# Patient Record
Sex: Male | Born: 1935 | Smoking: Former smoker
Health system: Southern US, Community
[De-identification: ages and names within clinical notes are randomized; demographics above are authoritative.]

## PROBLEM LIST (undated history)

## (undated) DIAGNOSIS — E785 Hyperlipidemia, unspecified: Secondary | ICD-10-CM

## (undated) DIAGNOSIS — D649 Anemia, unspecified: Secondary | ICD-10-CM

## (undated) DIAGNOSIS — K5790 Diverticulosis of intestine, part unspecified, without perforation or abscess without bleeding: Secondary | ICD-10-CM

## (undated) DIAGNOSIS — I10 Essential (primary) hypertension: Secondary | ICD-10-CM

## (undated) DIAGNOSIS — Z87891 Personal history of nicotine dependence: Secondary | ICD-10-CM

## (undated) HISTORY — DX: Diverticulosis of intestine, part unspecified, without perforation or abscess without bleeding: K57.90

## (undated) HISTORY — DX: Personal history of nicotine dependence: Z87.891

## (undated) HISTORY — DX: Essential (primary) hypertension: I10

## (undated) HISTORY — DX: Hyperlipidemia, unspecified: E78.5

## (undated) HISTORY — DX: Anemia, unspecified: D64.9

---

## 2014-03-27 ENCOUNTER — Inpatient Hospital Stay (HOSPITAL_COMMUNITY)
Admission: EM | Admit: 2014-03-27 | Discharge: 2014-03-29 | DRG: 378 | Disposition: A | Discharge status: Home / Self Care | Payer: Medicare Other | Attending: Internal Medicine | Admitting: Internal Medicine

## 2014-03-27 ENCOUNTER — Encounter (HOSPITAL_COMMUNITY): Payer: Self-pay | Admitting: Emergency Medicine

## 2014-03-27 DIAGNOSIS — Z136 Encounter for screening for cardiovascular disorders: Secondary | ICD-10-CM | POA: Diagnosis not present

## 2014-03-27 DIAGNOSIS — Z87891 Personal history of nicotine dependence: Secondary | ICD-10-CM | POA: Diagnosis not present

## 2014-03-27 DIAGNOSIS — R197 Diarrhea, unspecified: Secondary | ICD-10-CM | POA: Diagnosis not present

## 2014-03-27 DIAGNOSIS — R030 Elevated blood-pressure reading, without diagnosis of hypertension: Secondary | ICD-10-CM | POA: Diagnosis not present

## 2014-03-27 DIAGNOSIS — Z880 Allergy status to penicillin: Secondary | ICD-10-CM

## 2014-03-27 DIAGNOSIS — K5900 Constipation, unspecified: Secondary | ICD-10-CM | POA: Diagnosis present

## 2014-03-27 DIAGNOSIS — Z8489 Family history of other specified conditions: Secondary | ICD-10-CM

## 2014-03-27 DIAGNOSIS — Z800 Family history of malignant neoplasm of digestive organs: Secondary | ICD-10-CM | POA: Diagnosis not present

## 2014-03-27 DIAGNOSIS — K922 Gastrointestinal hemorrhage, unspecified: Secondary | ICD-10-CM | POA: Diagnosis present

## 2014-03-27 DIAGNOSIS — D6959 Other secondary thrombocytopenia: Secondary | ICD-10-CM | POA: Diagnosis present

## 2014-03-27 DIAGNOSIS — D126 Benign neoplasm of colon, unspecified: Secondary | ICD-10-CM | POA: Diagnosis present

## 2014-03-27 DIAGNOSIS — K921 Melena: Secondary | ICD-10-CM

## 2014-03-27 DIAGNOSIS — K5731 Diverticulosis of large intestine without perforation or abscess with bleeding: Secondary | ICD-10-CM | POA: Diagnosis present

## 2014-03-27 DIAGNOSIS — D696 Thrombocytopenia, unspecified: Secondary | ICD-10-CM

## 2014-03-27 DIAGNOSIS — D509 Iron deficiency anemia, unspecified: Secondary | ICD-10-CM | POA: Diagnosis not present

## 2014-03-27 DIAGNOSIS — D62 Acute posthemorrhagic anemia: Secondary | ICD-10-CM | POA: Diagnosis present

## 2014-03-27 DIAGNOSIS — K625 Hemorrhage of anus and rectum: Secondary | ICD-10-CM | POA: Diagnosis not present

## 2014-03-27 LAB — PROTIME-INR
INR: 1.01 (ref 0.00–1.49)
PROTHROMBIN TIME: 13.3 s (ref 11.6–15.2)

## 2014-03-27 LAB — APTT: aPTT: 31 seconds (ref 24–37)

## 2014-03-27 LAB — CBC
HCT: 26.2 % — ABNORMAL LOW (ref 39.0–52.0)
HCT: 24.2 % — ABNORMAL LOW (ref 39.0–52.0)
HEMOGLOBIN: 8.7 g/dL — AB (ref 13.0–17.0)
Hemoglobin: 8.1 g/dL — ABNORMAL LOW (ref 13.0–17.0)
MCH: 31.3 pg (ref 26.0–34.0)
MCH: 31.4 pg (ref 26.0–34.0)
MCHC: 33.2 g/dL (ref 30.0–36.0)
MCHC: 33.5 g/dL (ref 30.0–36.0)
MCV: 94.2 fL (ref 78.0–100.0)
MCV: 93.8 fL (ref 78.0–100.0)
PLATELETS: 179 10*3/uL (ref 150–400)
Platelets: 184 10*3/uL (ref 150–400)
RBC: 2.78 MIL/uL — ABNORMAL LOW (ref 4.22–5.81)
RBC: 2.58 MIL/uL — AB (ref 4.22–5.81)
RDW: 12.8 % (ref 11.5–15.5)
RDW: 12.9 % (ref 11.5–15.5)
WBC: 8.2 10*3/uL (ref 4.0–10.5)
WBC: 7.4 10*3/uL (ref 4.0–10.5)

## 2014-03-27 LAB — COMPREHENSIVE METABOLIC PANEL
ALK PHOS: 59 U/L (ref 39–117)
ALT: 13 U/L (ref 0–53)
AST: 16 U/L (ref 0–37)
Albumin: 3.4 g/dL — ABNORMAL LOW (ref 3.5–5.2)
Anion gap: 10 (ref 5–15)
BILIRUBIN TOTAL: 0.8 mg/dL (ref 0.3–1.2)
BUN: 20 mg/dL (ref 6–23)
CALCIUM: 8.6 mg/dL (ref 8.4–10.5)
CO2: 27 mEq/L (ref 19–32)
Chloride: 102 mEq/L (ref 96–112)
Creatinine, Ser: 0.79 mg/dL (ref 0.50–1.35)
GFR calc non Af Amer: 85 mL/min — ABNORMAL LOW (ref >90)
GLUCOSE: 105 mg/dL — AB (ref 70–99)
POTASSIUM: 4.4 meq/L (ref 3.7–5.3)
Sodium: 139 mEq/L (ref 137–147)
Total Protein: 6.2 g/dL (ref 6.0–8.3)

## 2014-03-27 LAB — ABO/RH: ABO/RH(D): O POS

## 2014-03-27 LAB — POC OCCULT BLOOD, ED: FECAL OCCULT BLD: POSITIVE — AB

## 2014-03-27 LAB — PREPARE RBC (CROSSMATCH)

## 2014-03-27 MED ORDER — SODIUM CHLORIDE 0.9 % IV BOLUS (SEPSIS)
1000.0000 mL | Freq: Once | INTRAVENOUS | Status: AC
  Administered 2014-03-27: 1000 mL via INTRAVENOUS

## 2014-03-27 MED ORDER — ONDANSETRON HCL 4 MG PO TABS: 4.0000 mg | ORAL_TABLET | Freq: Four times a day (QID) | ORAL | Status: DC | PRN

## 2014-03-27 MED ORDER — BISACODYL 5 MG PO TBEC
10.0000 mg | DELAYED_RELEASE_TABLET | Freq: Once | ORAL | Status: AC
  Administered 2014-03-27: 10 mg via ORAL
  Filled 2014-03-27: qty 2

## 2014-03-27 MED ORDER — ONDANSETRON HCL 4 MG/2ML IJ SOLN: 4.0000 mg | Freq: Four times a day (QID) | INTRAMUSCULAR | Status: DC | PRN

## 2014-03-27 MED ORDER — PEG 3350-KCL-NA BICARB-NACL 420 G PO SOLR
4000.0000 mL | Freq: Once | ORAL | Status: AC
  Administered 2014-03-27: 4000 mL via ORAL

## 2014-03-27 NOTE — H&P (Signed)
[facility] History and Physical  Christopher Bowen ZOX:[id number] DOB: 28-Apr-1935 DOA: 03/27/2014  Referring physician:  Lorre Munroe PCP:  No primary provider on file.   Chief Complaint:  Rectal bleeding  HPI:  The patient is a 79 y.o. year-old male with no primary care doctor and therefore no past medical history who presents with rectal bleeding for the last 2-3 days.  The patient was last at their baseline health about three days ago.  2 days ago, he developed watery bloody stools, bright red blood that filled the toilet. He had approximately 3-4 episodes of bloody, nonmucousy diarrhea that day with abdominal cramping. The following day, he felt well. He denied any fevers, chills, nausea, vomiting, belching, increased flatulence. He states that his appetite was poor and he did not eat very well. This morning, he had a recurrence of the bright red blood per history came to the emergency department. He denies recent travel. He frequency food, however he denies eating any raw seafood. No new animal contacts. No other family members have been sick. He denies any recent antibiotic exposure or NSAID use. He has never had a colonoscopy, but denies any changes to his stooling patterns other than some mild increased constipation over the last 2 years. He denies any night sweats, fevers, weight loss. He continues to have stools on a daily basis, but occasionally has some pellet-like stools.  He denies palpitations, shortness of breath, fatigue, cold intolerance.  In the emergency department, his vital signs are stable. His labs were notable for hemoglobin of 8.7, normal platelet count. His BMP was within normal limits.  INR is pending.  He is being admitted for GI bleed.  Review of Systems:  General:  Denies fevers, chills, weight loss or gain HEENT:  Denies changes to hearing and vision, rhinorrhea, sinus congestion, sore throat CV:  Denies chest pain and palpitations, lower extremity edema.  PULM:   Denies SOB, wheezing, cough.   GI:  Denies nausea, vomiting, constipation   GU:  Denies dysuria, frequency, urgency ENDO:  Denies polyuria, polydipsia.   HEME:  Denies hematemesis, melena, abnormal bruising or bleeding.  LYMPH:  Denies lymphadenopathy.   MSK:  Denies arthralgias, myalgias.   DERM:  Denies skin rash or ulcer.   NEURO:  Denies focal numbness, weakness, slurred speech, confusion, facial droop.  PSYCH:  Denies anxiety and depression.    History reviewed. No pertinent past medical history. History reviewed. No pertinent past surgical history. Social History:  reports that he quit smoking about 45 years ago. His smoking use included Cigarettes. He smoked 0.50 packs per day. He does not have any smokeless tobacco history on file. He reports that he does not drink alcohol or use illicit drugs.    Allergies  Allergen Reactions  . Penicillins Anaphylaxis    Family History  Problem Relation Age of Onset  . Colon cancer Neg Hx   . Ulcerative colitis Neg Hx   . Crohn's disease Neg Hx      Prior to Admission medications   Not on File   Physical Exam: Filed Vitals:   03/27/14 1158 03/27/14 1411  BP: 129/65 124/65  Pulse: 94 75  Temp: 98 F (36.7 C) 97.9 F (36.6 C)  TempSrc: Oral Oral  Resp: 14 18  SpO2: 100% 100%     General:  Caucasian male, no acute distress  Eyes:  PERRL, anicteric, non-injected.  ENT:  Nares clear.  OP clear, non-erythematous without plaques or exudates.  MMM.  Neck:  Supple without TM or JVD.    Lymph:  No cervical, supraclavicular, or submandibular LAD.  Cardiovascular:  RRR, normal S1, S2, 1/6 systolic murmur at the LSB and RSB.  2+ pulses, warm extremities  Respiratory:  CTA bilaterally without increased WOB.  Abdomen:  NABS.  Soft, ND/NT.  No obvious organomegaly or masses  Skin:  No rashes or focal lesions.  Musculoskeletal:  Normal bulk and tone.  No LE edema.  Psychiatric:  A & O x 4.  Appropriate affect.  Neurologic:   CN 3-12 intact.  5/5 strength.  Sensation intact.  Labs on Admission:  Basic Metabolic Panel:  Recent Labs Lab 03/27/14 1314  NA 139  K 4.4  CL 102  CO2 27  GLUCOSE 105*  BUN 20  CREATININE 0.79  CALCIUM 8.6   Liver Function Tests:  Recent Labs Lab 03/27/14 1314  AST 16  ALT 13  ALKPHOS 59  BILITOT 0.8  PROT 6.2  ALBUMIN 3.4*   No results found for this basename: LIPASE, AMYLASE,  in the last 168 hours No results found for this basename: AMMONIA,  in the last 168 hours CBC:  Recent Labs Lab 03/27/14 1314  WBC 8.2  HGB 8.7*  HCT 26.2*  MCV 94.2  PLT 184   Cardiac Enzymes: No results found for this basename: CKTOTAL, CKMB, CKMBINDEX, TROPONINI,  in the last 168 hours  BNP (last 3 results) No results found for this basename: PROBNP,  in the last 8760 hours CBG: No results found for this basename: GLUCAP,  in the last 168 hours  Radiological Exams on Admission: No results found.  EKG:    Assessment/Plan Active Problems:   GI bleed   Acute blood loss anemia  ---  Rectal bleeding, likely lower GI bleed since BUN is within normal limits and he has had bright red blood per rectum.  Differential diagnosis includes AVM, colon cancer, bleeding polyp, diverticulosis. -  GI consult to establish care and consideration of inpatient colonoscopy -  Clear liquid diet -  Repeat CBC in 6 hours and again in AM -  Followup INR  Acute blood loss anemia -  CBCs as above -  Transfuse for hgb < 7 or for symptomatic anemia/hemodynamic instability in setting of active bleed  Patient follows with PCP on Wed, appt was already scheduled.  Will defer management of chronic medical conditions to PCP.    Diet:  Clear liquid Access:  PIV IVF:  Yes Proph:  SCDs  Code Status: full Family Communication: patient and his daughter Disposition Plan: Admit to MedSurg  Time spent: 60 min Janece Canterbury [facility] Pager [phone]  If 7PM-7AM, please contact  night-coverage [email or web address] Password [facility] 03/27/2014, 4:28 PM

## 2014-03-27 NOTE — ED Notes (Signed)
PER Rob, PA, waiting for him to talk to admitting doctor before blood transfusion is started.  Rn will f/u and continue to monitor.

## 2014-03-27 NOTE — Consult Note (Signed)
[facility] Consultation Note  Referring Provider: Dr. Janece Canterbury [facility]) Primary Care Physician:  No primary provider on file. Primary Gastroenterologist:  None  Reason for Consultation:  Hematochezia, anemia  HPI: Christopher Bowen is a 79 y.o. male admitted for, and whom we've been asked to see on behalf of, hematochezia and anemia.  Still works in [employer].  Bleeding started a couple days ago with several bouts of bright red hematochezia, yesterday bleeding stopped completely, then bleeding restarted again today.  No abdominal pain, fevers, recent sick contacts, recent antibiotics.  Has noticed recent trend in bowel habits, over the past few months, with trend to constipation.  No loss-of-appetite or unintentional weight loss.   Denies family history of inflammatory bowel disease or colon cancer.  Hgb today 8.7, normocytic.  Patient himself has never had a colonoscopy.  History reviewed. No pertinent past medical history.  History reviewed. No pertinent past surgical history.  Prior to Admission medications   Not on File    Current Facility-Administered Medications  Medication Dose Route Frequency Provider Last Rate Last Dose  . bisacodyl (DULCOLAX) EC tablet 10 mg  10 mg Oral Once Arta Silence, MD      . ondansetron [facility]) tablet 4 mg  4 mg Oral Q6H PRN Janece Canterbury, MD       Or  . ondansetron (ZOFRAN) injection 4 mg  4 mg Intravenous Q6H PRN Janece Canterbury, MD      . polyethylene glycol-electrolytes (NuLYTELY/GoLYTELY) solution 4,000 mL  4,000 mL Oral Once Arta Silence, MD        Allergies as of 03/27/2014 - Review Complete 03/27/2014  Allergen Reaction Noted  . Penicillins Anaphylaxis 03/27/2014    Family History  Problem Relation Age of Onset  . Colon cancer Neg Hx   . Ulcerative colitis Neg Hx   . Crohn's disease Neg Hx     History   Social History  . Marital Status: Unknown    Spouse Name: N/A    Number of Children: N/A  . Years of  Education: N/A   Occupational History  . Not on file.   Social History Main Topics  . Smoking status: Former Smoker -- 0.50 packs/day    Types: Cigarettes    Quit date: 09/17/1968  . Smokeless tobacco: Not on file  . Alcohol Use: No  . Drug Use: No  . Sexual Activity: Not on file   Other Topics Concern  . Not on file   Social History Narrative   Lives alone, and does not use assist device, no home health services. 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      Review of Systems: ROS Dr. Sheran Fava 03/27/14 reviewed and I agree  Physical Exam: Vital signs in last 24 hours: Temp:  [97.9 F (36.6 C)-98 F (36.7 C)] 98 F (36.7 C) (07/11 1646) Pulse Rate:  [72-94] 74 (07/11 1739) Resp:  [14-18] 18 (07/11 1739) BP: (115-153)/(57-65) 153/57 mmHg (07/11 1739) SpO2:  [99 %-100 %] 99 % (07/11 1739) Weight:  [95.255 kg (210 lb)] 95.255 kg (210 lb) (07/11 1739) Last BM Date: 03/27/14 General:   Alert,  Well-developed, well-nourished, pleasant and cooperative in NAD Head:  Normocephalic and atraumatic. Eyes:  Sclera clear, no icterus.   Conjunctiva pale Ears:  Normal auditory acuity. Nose:  No deformity, discharge,  or lesions. Mouth:  No deformity or lesions.  Oropharynx somewhat dry, somewhat pale.  Poor dentition Neck:  Supple; no masses or thyromegaly. Lungs:  Clear throughout to auscultation.  No wheezes, crackles, or rhonchi. No acute distress. Heart:  Regular rate and rhythm; no murmurs, clicks, rubs,  or gallops. Abdomen:  Soft, nontender and nondistended. No masses, hepatosplenomegaly or hernias noted. Normal bowel sounds, without guarding, and without rebound.     Msk:  Symmetrical without gross deformities. Normal posture. Pulses:  Normal pulses noted. Extremities:  Without clubbing or edema. Neurologic:  Alert and  oriented x4;  grossly normal neurologically. Skin:  Intact without significant lesions or rashes. Cervical Nodes:  No significant cervical adenopathy. Psych:  Alert  and cooperative. Normal mood and affect.   Lab Results:  Recent Labs  03/27/14 1314  WBC 8.2  HGB 8.7*  HCT 26.2*  PLT 184   BMET  Recent Labs  03/27/14 1314  NA 139  K 4.4  CL 102  CO2 27  GLUCOSE 105*  BUN 20  CREATININE 0.79  CALCIUM 8.6   LFT  Recent Labs  03/27/14 1314  PROT 6.2  ALBUMIN 3.4*  AST 16  ALT 13  ALKPHOS 59  BILITOT 0.8   PT/INR No results found for this basename: LABPROT, INR,  in the last 72 hours  Studies/Results: No results found.  Impression:  1.  Bloody diarrhea.  History most suggestive of lower GI (i.e., colonic) source of bleeding.  Most likely considerations include diverticular bleeding versus inflammatory colitis (Crohn's or ulcerative colitis).  No known history of aortic aneurysm to support consideration of aortoenteric fistula.  Doubt ischemic or infectious colitis.   2.  Anemia, presumed acute blood loss, likely due to #1 above.  Plan:  1.  Clear liquid diet, NPO (except for prep) after midnight. 2.  Colonoscopy tomorrow late morning versus early afternoon. 3.  Risks (bleeding, infection, bowel perforation that could require surgery, sedation-related changes in cardiopulmonary systems), benefits (identification and possible treatment of source of symptoms, exclusion of certain causes of symptoms), and alternatives (watchful waiting, radiographic imaging studies, empiric medical treatment) of colonoscopy were explained to patient/family in detail and patient wishes to proceed.   LOS: 0 days   [name] M  03/27/2014, 7:11 PM

## 2014-03-27 NOTE — ED Provider Notes (Signed)
CSN: [id number]     Arrival date & time 03/27/14  1146 History   First MD Initiated Contact with Patient 03/27/14 1213     Chief Complaint  Patient presents with  . Rectal Bleeding     (Consider location/radiation/quality/duration/timing/severity/associated sxs/prior Treatment) HPI Comments: Patient presents emergency department with chief complaint of rectal bleeding. He states that over the past 2 days, he has noticed some bright red blood per rectum. States that he has felt weak in the mornings, and a little dizzy. He reports that he is having a small amount of bleeding, even when he is not having bowel movements. He denies any fevers, chills, nausea, vomiting, diarrhea, dysuria. He states that he has been constipated for the past few months. He has not taken anything to alleviate his symptoms. He states that he is otherwise healthy. He has not taken any blood thinners. He has no history of GI bleed.  The history is provided by the patient. No language interpreter was used.    History reviewed. No pertinent past medical history. History reviewed. No pertinent past surgical history. History reviewed. No pertinent family history. History  Substance Use Topics  . Smoking status: Never Smoker   . Smokeless tobacco: Not on file  . Alcohol Use: No    Review of Systems  All other systems reviewed and are negative.     Allergies  Penicillins  Home Medications   Prior to Admission medications   Not on File   BP 129/65  Pulse 94  Temp(Src) 98 F (36.7 C) (Oral)  Resp 14  SpO2 100% Physical Exam  Nursing note and vitals reviewed. Constitutional: He is oriented to person, place, and time. He appears well-developed and well-nourished.  HENT:  Head: Normocephalic and atraumatic.  Eyes: Conjunctivae and EOM are normal. Pupils are equal, round, and reactive to light. Right eye exhibits no discharge. Left eye exhibits no discharge. No scleral icterus.  Neck: Normal range of  motion. Neck supple. No JVD present.  Cardiovascular: Normal rate, regular rhythm and normal heart sounds.  Exam reveals no gallop and no friction rub.   No murmur heard. Pulmonary/Chest: Effort normal and breath sounds normal. No respiratory distress. He has no wheezes. He has no rales. He exhibits no tenderness.  Abdominal: Soft. He exhibits no distension and no mass. There is no tenderness. There is no rebound and no guarding.  No focal abdominal tenderness, no RLQ tenderness or pain at McBurney's point, no RUQ tenderness or Murphy's sign, no left-sided abdominal tenderness, no fluid wave, or signs of peritonitis   Genitourinary:  Chaperone was present during exam.  Rectal exam remarkable for soft maroon colored stool, no hemorrhage, no bright red blood  Musculoskeletal: Normal range of motion. He exhibits no edema and no tenderness.  Neurological: He is alert and oriented to person, place, and time.  Skin: Skin is warm and dry.  Psychiatric: He has a normal mood and affect. His behavior is normal. Judgment and thought content normal.    ED Course  Procedures (including critical care time) Results for orders placed during the hospital encounter of 03/27/14  CBC      Result Value Ref Range   WBC 8.2  4.0 - 10.5 K/uL   RBC 2.78 (*) 4.22 - 5.81 MIL/uL   Hemoglobin 8.7 (*) 13.0 - 17.0 g/dL   HCT 26.2 (*) 39.0 - 52.0 %   MCV 94.2  78.0 - 100.0 fL   MCH 31.3  26.0 - 34.0 pg  MCHC 33.2  30.0 - 36.0 g/dL   RDW 12.8  11.5 - 15.5 %   Platelets 184  150 - 400 K/uL  COMPREHENSIVE METABOLIC PANEL      Result Value Ref Range   Sodium 139  137 - 147 mEq/L   Potassium 4.4  3.7 - 5.3 mEq/L   Chloride 102  96 - 112 mEq/L   CO2 27  19 - 32 mEq/L   Glucose, Bld 105 (*) 70 - 99 mg/dL   BUN 20  6 - 23 mg/dL   Creatinine, Ser 0.79  0.50 - 1.35 mg/dL   Calcium 8.6  8.4 - 10.5 mg/dL   Total Protein 6.2  6.0 - 8.3 g/dL   Albumin 3.4 (*) 3.5 - 5.2 g/dL   AST 16  0 - 37 U/L   ALT 13  0 - 53 U/L    Alkaline Phosphatase 59  39 - 117 U/L   Total Bilirubin 0.8  0.3 - 1.2 mg/dL   GFR calc non Af Amer 85 (*) >90 mL/min   GFR calc Af Amer >90  >90 mL/min   Anion gap 10  5 - 15  POC OCCULT BLOOD, ED      Result Value Ref Range   Fecal Occult Bld POSITIVE (*) NEGATIVE  TYPE AND SCREEN      Result Value Ref Range   ABO/RH(D) O POS     Antibody Screen NEG     Sample Expiration 03/30/2014     Unit Number [id number]     Blood Component Type RED CELLS,LR     Unit division 00     Status of Unit ALLOCATED     Transfusion Status OK TO TRANSFUSE     Crossmatch Result Compatible     Unit Number [id number]     Blood Component Type RBC LR PHER2     Unit division 00     Status of Unit ALLOCATED     Transfusion Status OK TO TRANSFUSE     Crossmatch Result Compatible    PREPARE RBC (CROSSMATCH)      Result Value Ref Range   Order Confirmation ORDER PROCESSED BY BLOOD BANK    ABO/RH      Result Value Ref Range   ABO/RH(D) O POS         EKG Interpretation None      MDM   Final diagnoses:  Gastrointestinal hemorrhage with melena   Medications  sodium chloride 0.9 % bolus 1,000 mL (1,000 mLs Intravenous New Bag/Given 03/27/14 1311)      Patient seen by and discussed with Dr. Ashok Cordia.  GI bleed.  No known hx.  No anti-coagulants. Will admit to medicine.  Montine Circle, PA-C 03/27/14 1554

## 2014-03-27 NOTE — ED Notes (Signed)
Called to give report, receiving RN will return phone call.

## 2014-03-27 NOTE — ED Notes (Signed)
Pt states he has been constipated the last few months and has had hard stools.  C/o BRB per rectum x 2 days. States that he has felt a little weak over the past couple of days.  States that it's not just when he uses the bathroom.  He states that it just oozes out.

## 2014-03-27 NOTE — ED Notes (Signed)
I tried twice to stick patient but was unsuccessful

## 2014-03-28 ENCOUNTER — Encounter (HOSPITAL_COMMUNITY): Payer: Self-pay | Admitting: *Deleted

## 2014-03-28 ENCOUNTER — Encounter (HOSPITAL_COMMUNITY)
Admission: EM | Disposition: A | Discharge status: Home / Self Care | Payer: Self-pay | Source: Home / Self Care | Attending: Internal Medicine

## 2014-03-28 DIAGNOSIS — D62 Acute posthemorrhagic anemia: Secondary | ICD-10-CM | POA: Diagnosis not present

## 2014-03-28 DIAGNOSIS — K921 Melena: Secondary | ICD-10-CM | POA: Diagnosis not present

## 2014-03-28 DIAGNOSIS — D696 Thrombocytopenia, unspecified: Secondary | ICD-10-CM

## 2014-03-28 DIAGNOSIS — D126 Benign neoplasm of colon, unspecified: Secondary | ICD-10-CM | POA: Diagnosis not present

## 2014-03-28 DIAGNOSIS — D509 Iron deficiency anemia, unspecified: Secondary | ICD-10-CM | POA: Diagnosis not present

## 2014-03-28 DIAGNOSIS — R197 Diarrhea, unspecified: Secondary | ICD-10-CM | POA: Diagnosis not present

## 2014-03-28 HISTORY — PX: COLONOSCOPY: SHX5424

## 2014-03-28 LAB — BASIC METABOLIC PANEL
ANION GAP: 9 (ref 5–15)
BUN: 15 mg/dL (ref 6–23)
CHLORIDE: 103 meq/L (ref 96–112)
CO2: 27 mEq/L (ref 19–32)
Calcium: 8.1 mg/dL — ABNORMAL LOW (ref 8.4–10.5)
Creatinine, Ser: 0.72 mg/dL (ref 0.50–1.35)
GFR calc non Af Amer: 88 mL/min — ABNORMAL LOW (ref >90)
Glucose, Bld: 98 mg/dL (ref 70–99)
Potassium: 3.9 mEq/L (ref 3.7–5.3)
SODIUM: 139 meq/L (ref 137–147)

## 2014-03-28 LAB — CBC
HCT: 21.4 % — ABNORMAL LOW (ref 39.0–52.0)
Hemoglobin: 7.1 g/dL — ABNORMAL LOW (ref 13.0–17.0)
MCH: 31.6 pg (ref 26.0–34.0)
MCHC: 33.2 g/dL (ref 30.0–36.0)
MCV: 95.1 fL (ref 78.0–100.0)
Platelets: 143 10*3/uL — ABNORMAL LOW (ref 150–400)
RBC: 2.25 MIL/uL — ABNORMAL LOW (ref 4.22–5.81)
RDW: 13 % (ref 11.5–15.5)
WBC: 5.7 10*3/uL (ref 4.0–10.5)

## 2014-03-28 SURGERY — COLONOSCOPY
Anesthesia: Moderate Sedation | Laterality: Left

## 2014-03-28 MED ORDER — SODIUM CHLORIDE 0.9 % IV SOLN
INTRAVENOUS | Status: DC
Start: 2014-03-28 — End: 2014-03-28
  Administered 2014-03-28: 500 mL via INTRAVENOUS

## 2014-03-28 MED ORDER — MIDAZOLAM HCL 5 MG/5ML IJ SOLN
INTRAMUSCULAR | Status: DC | PRN
  Administered 2014-03-28 (×2): 1 mg via INTRAVENOUS
  Administered 2014-03-28: 2 mg via INTRAVENOUS
  Administered 2014-03-28: 1 mg via INTRAVENOUS

## 2014-03-28 MED ORDER — FENTANYL CITRATE 0.05 MG/ML IJ SOLN
INTRAMUSCULAR | Status: AC
  Filled 2014-03-28: qty 2

## 2014-03-28 MED ORDER — MIDAZOLAM HCL 10 MG/2ML IJ SOLN
INTRAMUSCULAR | Status: AC
  Filled 2014-03-28: qty 2

## 2014-03-28 MED ORDER — FENTANYL CITRATE 0.05 MG/ML IJ SOLN
INTRAMUSCULAR | Status: DC | PRN
  Administered 2014-03-28 (×2): 25 ug via INTRAVENOUS

## 2014-03-28 NOTE — H&P (View-Only) (Signed)
[facility] PROGRESS NOTE  Christopher Bowen AFB:[id number] DOB: Aug 06, 1935 DOA: 03/27/2014 PCP: No primary provider on file.  Assessment/Plan  Rectal bleeding, likely lower GI bleed since BUN is within normal limits and he has had bright red blood per rectum. Differential diagnosis includes AVM, colon cancer, bleeding polyp, diverticulosis.   - appreciate GI assistance:  Completed cleanout and colonoscopy today  - Clear liquid diet   Acute blood loss anemia, hgb 7.1 and trending down  - CBCs as above  - Transfuse 2 units PRBC  Thrombocytopenia likely due to acute blood loss -  Repeat in AM  Patient follows with PCP on Wed, appt was already scheduled. Will defer management of chronic medical conditions to PCP.   Diet: NPO Access: PIV  IVF: Yes  Proph: SCDs   Code Status: full  Family Communication: patient and his daughter  Disposition Plan:  Awaiting colonoscopy    Consultants:  GI, Dr. Paulita Fujita  Procedures:  Transfuse 2 units PRBC on 7/12  Antibiotics:  none   HPI/Subjective:  Had two more bloody BMs yesterday, now pooping clear water from cleanout.  Denies fatigue, SOB, abdominal pain, nausea.    Objective: Filed Vitals:   03/28/14 0828 03/28/14 0853 03/28/14 0953 03/28/14 1053  BP: 124/61 121/71 114/72 135/75  Pulse: 76 80 74 74  Temp: 98.4 F (36.9 C) 98.1 F (36.7 C) 98.8 F (37.1 C) 97.9 F (36.6 C)  TempSrc: Oral Oral Oral Oral  Resp: 18 18 18 18   Height:      Weight:      SpO2: 97% 96% 96% 96%    Intake/Output Summary (Last 24 hours) at 03/28/14 1154 Last data filed at 03/28/14 0957  Gross per 24 hour  Intake   2490 ml  Output    650 ml  Net   1840 ml   Filed Weights   03/27/14 1739  Weight: 95.255 kg (210 lb)    Exam:   General:  CM, No acute distress  HEENT:  NCAT, MMM  Cardiovascular:  RRR, nl S1, S2, 1/6 systolic murmur LSB, 2+ pulses, warm extremities  Respiratory:  CTAB, no increased WOB  Abdomen:   NABS, soft,  NT/ND  MSK:   Normal tone and bulk, no LEE  Neuro:  Grossly intact  Data Reviewed: Basic Metabolic Panel:  Recent Labs Lab 03/27/14 1314 03/28/14 0536  NA 139 139  K 4.4 3.9  CL 102 103  CO2 27 27  GLUCOSE 105* 98  BUN 20 15  CREATININE 0.79 0.72  CALCIUM 8.6 8.1*   Liver Function Tests:  Recent Labs Lab 03/27/14 1314  AST 16  ALT 13  ALKPHOS 59  BILITOT 0.8  PROT 6.2  ALBUMIN 3.4*   No results found for this basename: LIPASE, AMYLASE,  in the last 168 hours No results found for this basename: AMMONIA,  in the last 168 hours CBC:  Recent Labs Lab 03/27/14 1314 03/27/14 1858 03/28/14 0536  WBC 8.2 7.4 5.7  HGB 8.7* 8.1* 7.1*  HCT 26.2* 24.2* 21.4*  MCV 94.2 93.8 95.1  PLT 184 179 143*   Cardiac Enzymes: No results found for this basename: CKTOTAL, CKMB, CKMBINDEX, TROPONINI,  in the last 168 hours BNP (last 3 results) No results found for this basename: PROBNP,  in the last 8760 hours CBG: No results found for this basename: GLUCAP,  in the last 168 hours  No results found for this or any previous visit (from the past 240 hour(s)).   Studies: No  results found.  Scheduled Meds:  Continuous Infusions:   Active Problems:   GI bleed   Acute blood loss anemia    Time spent: 30 min    Hafiz Irion, [facility] Pager [phone]. If 7PM-7AM, please contact night-coverage at [email or web address], password [facility] 03/28/2014, 11:54 AM  LOS: 1 day

## 2014-03-28 NOTE — Op Note (Signed)
[facility] [place], [address]   COLONOSCOPY PROCEDURE REPORT  PATIENT: Christopher Bowen, Christopher Bowen  MR#: [id number] BIRTHDATE: 12/06/1934 , 78  yrs. old GENDER: Male ENDOSCOPIST: Arta Silence, MD REFERRED XE:[facility] PROCEDURE DATE:  03/28/2014 PROCEDURE:   Colonoscopy with snare polypectomy ASA CLASS:   Class I INDICATIONS:hematochezia, anemia. MEDICATIONS: Fentanyl 50 mcg IV and Versed 5 mg IV DESCRIPTION OF PROCEDURE:   After the risks benefits and alternatives of the procedure were thoroughly explained, informed consent was obtained.  A digital rectal exam revealed no abnormalities of the rectum.   The Pentax Ped Colon [id number] endoscope was introduced through the anus and advanced to the terminal ileum which was intubated for a short distance. No adverse events experienced.   The quality of the prep was good.  The instrument was then slowly withdrawn as the colon was fully examined.  Findings:  Normal digital rectal exam.  Couple nodules protruding from the dentate line, one fairly beefy, appearance typical of squamous papillomata.  Retroflexion into rectum otherwise normal. 44mm pedunculated sigmoid polyp, 85mm sessile transverse polyp, 79mm sessile transverse polyp, all removed with hot snare.  Few medium-sized diverticula in the proximal transverse colon.  No other pathology noted.  No vascular anomalies.  No blood seen to the extent of our examination.  Distal 5cm of the terminal ileum was normal.            .  The scope was withdrawn and the procedure completed. ENDOSCOPIC IMPRESSION:     As above.  Suspect bleeding was diverticular in origin.  The start-stop-start and rather dramatic quality of his bleeding gives pause to consider primary aortoenteric fistula, but this would be quite rare.  Normal BUN argues against GI bleed proximal to ligament of Treitz.  RECOMMENDATIONS:     1.  Watch for potential complications of procedure. 2.   Avoid ASA, NSAIDs for 3 days post-polypectomy. 3.  Await polypectomy results.  Might consider repeat surveillance colonoscopy in 3-5 years. 4.  Advance diet today. 5.  Abdominal ultrasound tomorrow to  exclude aortic aneurysm. 6.  Consider elective surgical evaluation for patient's presumed squamous papillomata. 7.  If patient has interval rebleeding, would do tagged RBC study as next step in evaluation. 8.  [facility] to follow. If U/S normal and no rebleeding, consider discharge tomorrow.  eSigned:  Arta Silence, MD 03/28/2014 1:45 PM   cc:

## 2014-03-28 NOTE — Progress Notes (Addendum)
[facility] PROGRESS NOTE  Christopher Bowen GBE:[id number] DOB: 1935-07-27 DOA: 03/27/2014 PCP: No primary provider on file.  Assessment/Plan  Rectal bleeding, likely lower GI bleed since BUN is within normal limits and he has had bright red blood per rectum. Differential diagnosis includes AVM, colon cancer, bleeding polyp, diverticulosis.   - appreciate GI assistance:  Completed cleanout and colonoscopy today  - Clear liquid diet   Acute blood loss anemia, hgb 7.1 and trending down  - CBCs as above  - Transfuse 2 units PRBC  Thrombocytopenia likely due to acute blood loss -  Repeat in AM  Patient follows with PCP on Wed, appt was already scheduled. Will defer management of chronic medical conditions to PCP.   Diet: NPO Access: PIV  IVF: Yes  Proph: SCDs   Code Status: full  Family Communication: patient and his daughter  Disposition Plan:  Awaiting colonoscopy    Consultants:  GI, Dr. Paulita Fujita  Procedures:  Transfuse 2 units PRBC on 7/12  Antibiotics:  none   HPI/Subjective:  Had two more bloody BMs yesterday, now pooping clear water from cleanout.  Denies fatigue, SOB, abdominal pain, nausea.    Objective: Filed Vitals:   03/28/14 0828 03/28/14 0853 03/28/14 0953 03/28/14 1053  BP: 124/61 121/71 114/72 135/75  Pulse: 76 80 74 74  Temp: 98.4 F (36.9 C) 98.1 F (36.7 C) 98.8 F (37.1 C) 97.9 F (36.6 C)  TempSrc: Oral Oral Oral Oral  Resp: 18 18 18 18   Height:      Weight:      SpO2: 97% 96% 96% 96%    Intake/Output Summary (Last 24 hours) at 03/28/14 1154 Last data filed at 03/28/14 0957  Gross per 24 hour  Intake   2490 ml  Output    650 ml  Net   1840 ml   Filed Weights   03/27/14 1739  Weight: 95.255 kg (210 lb)    Exam:   General:  CM, No acute distress  HEENT:  NCAT, MMM  Cardiovascular:  RRR, nl S1, S2, 1/6 systolic murmur LSB, 2+ pulses, warm extremities  Respiratory:  CTAB, no increased WOB  Abdomen:   NABS, soft,  NT/ND  MSK:   Normal tone and bulk, no LEE  Neuro:  Grossly intact  Data Reviewed: Basic Metabolic Panel:  Recent Labs Lab 03/27/14 1314 03/28/14 0536  NA 139 139  K 4.4 3.9  CL 102 103  CO2 27 27  GLUCOSE 105* 98  BUN 20 15  CREATININE 0.79 0.72  CALCIUM 8.6 8.1*   Liver Function Tests:  Recent Labs Lab 03/27/14 1314  AST 16  ALT 13  ALKPHOS 59  BILITOT 0.8  PROT 6.2  ALBUMIN 3.4*   No results found for this basename: LIPASE, AMYLASE,  in the last 168 hours No results found for this basename: AMMONIA,  in the last 168 hours CBC:  Recent Labs Lab 03/27/14 1314 03/27/14 1858 03/28/14 0536  WBC 8.2 7.4 5.7  HGB 8.7* 8.1* 7.1*  HCT 26.2* 24.2* 21.4*  MCV 94.2 93.8 95.1  PLT 184 179 143*   Cardiac Enzymes: No results found for this basename: CKTOTAL, CKMB, CKMBINDEX, TROPONINI,  in the last 168 hours BNP (last 3 results) No results found for this basename: PROBNP,  in the last 8760 hours CBG: No results found for this basename: GLUCAP,  in the last 168 hours  No results found for this or any previous visit (from the past 240 hour(s)).   Studies: No  results found.  Scheduled Meds:  Continuous Infusions:   Active Problems:   GI bleed   Acute blood loss anemia    Time spent: 30 min    Marguriete Wootan, [facility] Pager [phone]. If 7PM-7AM, please contact night-coverage at [email or web address], password [facility] 03/28/2014, 11:54 AM  LOS: 1 day

## 2014-03-28 NOTE — Interval H&P Note (Signed)
History and Physical Interval Note:  03/28/2014 12:50 PM  Christopher Bowen  has presented today for surgery, with the diagnosis of Bloody diarrhea  The various methods of treatment have been discussed with the patient and family. After consideration of risks, benefits and other options for treatment, the patient has consented to  Procedure(s): COLONOSCOPY (Left) as a surgical intervention .  The patient's history has been reviewed, patient examined, no change in status, stable for surgery.  I have reviewed the patient's chart and labs.  Questions were answered to the patient's satisfaction.     Christopher Bowen M  Assessment:  1.  Hematochezia, bloody diarrhea. 2.  Acute blood loss anemia.  Plan:  1.  Colonoscopy. 2.  Risks (bleeding, infection, bowel perforation that could require surgery, sedation-related changes in cardiopulmonary systems), benefits (identification and possible treatment of source of symptoms, exclusion of certain causes of symptoms), and alternatives (watchful waiting, radiographic imaging studies, empiric medical treatment) of colonoscopy were explained to patient/family in detail and patient wishes to proceed.

## 2014-03-29 ENCOUNTER — Encounter (HOSPITAL_COMMUNITY): Payer: Self-pay | Admitting: Gastroenterology

## 2014-03-29 ENCOUNTER — Inpatient Hospital Stay (HOSPITAL_COMMUNITY): Payer: Medicare Other

## 2014-03-29 DIAGNOSIS — R197 Diarrhea, unspecified: Secondary | ICD-10-CM | POA: Diagnosis not present

## 2014-03-29 DIAGNOSIS — D509 Iron deficiency anemia, unspecified: Secondary | ICD-10-CM | POA: Diagnosis not present

## 2014-03-29 DIAGNOSIS — Z136 Encounter for screening for cardiovascular disorders: Secondary | ICD-10-CM | POA: Diagnosis not present

## 2014-03-29 DIAGNOSIS — K921 Melena: Secondary | ICD-10-CM | POA: Diagnosis not present

## 2014-03-29 DIAGNOSIS — D126 Benign neoplasm of colon, unspecified: Secondary | ICD-10-CM | POA: Diagnosis not present

## 2014-03-29 DIAGNOSIS — D62 Acute posthemorrhagic anemia: Secondary | ICD-10-CM | POA: Diagnosis not present

## 2014-03-29 LAB — TYPE AND SCREEN
ABO/RH(D): O POS
Antibody Screen: NEGATIVE
UNIT DIVISION: 0
Unit division: 0

## 2014-03-29 NOTE — Progress Notes (Signed)
[facility] PROGRESS NOTE  Christopher Bowen HAL:[id number] DOB: 12-Jun-1935 DOA: 03/27/2014 PCP: Odette Fraction, MD  Assessment/Plan:   Rectal Bleeding, likely diverticular  - Colonoscopy yesterday, showed diverticular disease which is the presumed cause.  However, due to nature of the bleed, there is some concern for a fistula, plan to check for AAA.  - Please see colonoscopy report for full discussion - Ultrasound of abdomen for possible AAA - Avoid ASA, NSAIDs for 3 days after polypectomy  Acute blood loss anemia - Received 2 Units PRBC yesterday, no symptoms of anemia today  Elevated BP - Intermittent, no diagnosis of HTN and no meds - Follow up with PCP as below.   Christopher Bowen has had no PCP for a while and is not diagnosed with anything.  He has a PCP appointment on Wednesday, and we will try and have him discharged prior to that appointment.   He will need a repeat CBC at that time.    Consultants:  GI  Procedures/Studies:  No results found.  Colonoscopy done yesterday  Antibiotics:  none  Code Status: Full Family Communication: Pt and daughter at bedside Disposition Plan: Home when medically stable, possibly today pending ultrasound for AAA.   HPI/Subjective: No events overnight.  Patient doing well.  Curious when ultrasound will be done.   Objective: Filed Vitals:   03/28/14 1700 03/28/14 1800 03/28/14 2117 03/29/14 0600  BP: 149/79 117/67 120/74 144/67  Pulse: 81 75 73 66  Temp: 97.6 F (36.4 C) 97.9 F (36.6 C) 98.4 F (36.9 C) 97.6 F (36.4 C)  TempSrc: Oral Oral Oral Oral  Resp: 18 18 16 18   Height:      Weight:      SpO2: 99% 95% 95% 95%    Intake/Output Summary (Last 24 hours) at 03/29/14 1313 Last data filed at 03/29/14 0932  Gross per 24 hour  Intake 1393.33 ml  Output      0 ml  Net 1393.33 ml    Exam:   General:  Pt is alert, follows commands appropriately, not in acute distress, elderly gentleman with multiple Seb K's on  face  Cardiovascular: Regular rate and rhythm, S1/S2, no murmurs  Respiratory: Clear to auscultation bilaterally, no wheezing, no crackles  Abdomen: Soft, non tender, non distended, bowel sounds present,   Extremities: No edema  Neuro: Grossly nonfocal  Data Reviewed: Basic Metabolic Panel:  Recent Labs Lab 03/27/14 1314 03/28/14 0536  NA 139 139  K 4.4 3.9  CL 102 103  CO2 27 27  GLUCOSE 105* 98  BUN 20 15  CREATININE 0.79 0.72  CALCIUM 8.6 8.1*   Liver Function Tests:  Recent Labs Lab 03/27/14 1314  AST 16  ALT 13  ALKPHOS 59  BILITOT 0.8  PROT 6.2  ALBUMIN 3.4*  CBC:  Recent Labs Lab 03/27/14 1314 03/27/14 1858 03/28/14 0536  WBC 8.2 7.4 5.7  HGB 8.7* 8.1* 7.1*  HCT 26.2* 24.2* 21.4*  MCV 94.2 93.8 95.1  PLT 184 179 143*     Scheduled Meds:  Continuous Infusions:    Gilles Chiquito, MD  [facility] Pager [phone]  If 7PM-7AM, please contact night-coverage [email or web address] Password [email or web address] 03/29/2014, 1:13 PM   LOS: 2 days

## 2014-03-29 NOTE — ED Provider Notes (Signed)
Medical screening examination/treatment/procedure(s) were conducted as a shared visit with non-physician practitioner(s) and myself.  I personally evaluated the patient during the encounter.  Pt w recurrent rectal bleeding, ?secondary to diverticula.  abd soft nt. Iv ns. Labs.   Mirna Mires, MD 03/29/14 [phone]

## 2014-03-29 NOTE — Discharge Instructions (Signed)
Christopher Bowen - -   You were admitted for a bleed from your GI tract due to diverticulosis, more information below.  You had a colonoscopy which showed the diverticulae in your colon.  It is important that you follow up with a primary care physician in the near future to make sure your health is being well looked after.   At your PCP appointment, you should have a complete blood count and basic metabolic panel (blood work) done to make sure your kidneys and blood counts are doing okay.   If you have any of the following symptoms, please come back to the hospital: dizziness, lightheadedness, loss of consciousness, further bleeding from the rectum, dark stools, chest pain, problems breathing, nausea, vomiting.   Diverticulosis Diverticulosis is the condition that develops when small pouches (diverticula) form in the wall of your colon. Your colon, or large intestine, is where water is absorbed and stool is formed. The pouches form when the inside layer of your colon pushes through weak spots in the outer layers of your colon. CAUSES  No one knows exactly what causes diverticulosis. RISK FACTORS  Being older than 39. Your risk for this condition increases with age. Diverticulosis is rare in people younger than 40 years. By age 95, almost everyone has it.  Eating a low-fiber diet.  Being frequently constipated.  Being overweight.  Not getting enough exercise.  Smoking.  Taking over-the-counter pain medicines, like aspirin and ibuprofen. SYMPTOMS  Most people with diverticulosis do not have symptoms. DIAGNOSIS  Because diverticulosis often has no symptoms, health care providers often discover the condition during an exam for other colon problems. In many cases, a health care provider will diagnose diverticulosis while using a flexible scope to examine the colon (colonoscopy). TREATMENT  If you have never developed an infection related to diverticulosis, you may not need treatment. If you have  had an infection before, treatment may include:  Eating more fruits, vegetables, and grains.  Taking a fiber supplement.  Taking a live bacteria supplement (probiotic).  Taking medicine to relax your colon. HOME CARE INSTRUCTIONS   Drink at least 6-8 glasses of water each day to prevent constipation.  Try not to strain when you have a bowel movement.  Keep all follow-up appointments. If you have had an infection before:  Increase the fiber in your diet as directed by your health care provider or dietitian.  Take a dietary fiber supplement if your health care provider approves.  Only take medicines as directed by your health care provider. SEEK MEDICAL CARE IF:   You have abdominal pain.  You have bloating.  You have cramps.  You have not gone to the bathroom in 3 days. SEEK IMMEDIATE MEDICAL CARE IF:   Your pain gets worse.  Yourbloating becomes very bad.  You have a fever or chills, and your symptoms suddenly get worse.  You begin vomiting.  You have bowel movements that are bloody or black. MAKE SURE YOU:  Understand these instructions.  Will watch your condition.  Will get help right away if you are not doing well or get worse. Document Released: 05/31/2004 Document Revised: 09/08/2013 Document Reviewed: 07/29/2013 [facility] Patient Information 2015 [place], [place]. This information is not intended to replace advice given to you by your health care provider. Make sure you discuss any questions you have with your health care provider.

## 2014-03-29 NOTE — Discharge Summary (Signed)
Physician Discharge Summary  Christopher Bowen XFG:[id number] DOB: 12/20/1934 DOA: 03/27/2014  PCP: Odette Fraction, MD  Admit date: 03/27/2014 Discharge date: 03/29/2014  Recommendations for Outpatient Follow-up:  1. Pt will need to follow up with PCP in 2-3 days post discharge 2. Please obtain BMP to evaluate electrolytes and kidney function 3. Please also check CBC to evaluate Hg and Hct levels 4. F/U with Dr. Paulita Fujita in 2 weeks  Discharge Diagnoses:  Active Problems:   GI bleed   Acute blood loss anemia   Thrombocytopenia    Discharge Condition: Stable  Diet recommendation: Heart healthy diet   History of present illness from H&P by Dr. Sheran Fava:  The patient is a 79 y.o. year-old male with no primary care doctor and therefore no past medical history who presents with rectal bleeding for the last 2-3 days. The patient was last at their baseline health about three days ago. 2 days ago, he developed watery bloody stools, bright red blood that filled the toilet. He had approximately 3-4 episodes of bloody, nonmucousy diarrhea that day with abdominal cramping. The following day, he felt well. He denied any fevers, chills, nausea, vomiting, belching, increased flatulence. He states that his appetite was poor and he did not eat very well. This morning, he had a recurrence of the bright red blood per history came to the emergency department. He denies recent travel. He frequency food, however he denies eating any raw seafood. No new animal contacts. No other family members have been sick. He denies any recent antibiotic exposure or NSAID use. He has never had a colonoscopy, but denies any changes to his stooling patterns other than some mild increased constipation over the last 2 years. He denies any night sweats, fevers, weight loss. He continues to have stools on a daily basis, but occasionally has some pellet-like stools. He denies palpitations, shortness of breath, fatigue, cold intolerance.   In  the emergency department, his vital signs are stable. His labs were notable for hemoglobin of 8.7, normal platelet count. His BMP was within normal limits. INR is pending. He is being admitted for GI bleed.   Hospital Course:  GI bleed with Acute blood loss anemia from diverticular disease: patient admitted with rectal bleeding which was new for about 2-3 days.  He had not really had any other symptoms.  He was noted to have a low H/H in the ED with Hgb down to 8.7 and a nadir of 7.1.  His bleeding stopped in the hospital.  He received 2 units of PRBCs.  He had a colonoscopy by Dr. Paulita Fujita which showed diverticular disease and this was thought to be the cause of his GI bleed.  There was some concern, given the "start-stop" nature of his bleed that he could have a fistula, thus an ultrasound of his aorta was done and revealed no AAA.  Christopher Bowen was doing well on day of discharge with no new complaints, particularly no dizziness, lightheadedness, CP, SOB or any further bleeding.  He was deemed stable for discharge with follow up in the outpatient clinic in the near future.   Thrombocytopenia likely related to acute blood loss.  Nadir of 143.  Will need to be followed up as an outpatient.   Christopher Bowen has no PCP and is on no medications.  He is due to have a PCP appointment on Wednesday of this week (7/15) where he will discuss any screening for chronic disease he should need.  He had an ultrasound to evaluate  for AAA which was negative, report below.     Procedures/Studies: US Aorta  03/29/2014   CLINICAL DATA:  Anemia, evaluate for abdominal aortic aneurysm  EXAM: ULTRASOUND OF ABDOMINAL AORTA  TECHNIQUE: Ultrasound examination of the abdominal aorta was performed to evaluate for abdominal aortic aneurysm.  COMPARISON:  None.  FINDINGS: Abdominal Aorta  No aneurysm identified.  Maximum AP  Diameter:  2.6 cm  Maximum TRV  Diameter: 2.5 cm.  Mild right common iliac ectasia measuring 1.8 cm in maximal AP  diameter.  Left common iliac mild ectasia of measures 1.6 cm in maximal transverse diameter.  IMPRESSION: No aortic aneurysm identified.  Mild iliac arterial ectasia.   Electronically Signed   By: Conchita Paris M.D.   On: 03/29/2014 16:07      Consultations:  Gastroenterology  Antibiotics:  None  Discharge Exam: Filed Vitals:   03/29/14 1300  BP: 145/71  Pulse: 89  Temp: 97.9 F (36.6 C)  Resp: 16   Filed Vitals:   03/28/14 1800 03/28/14 2117 03/29/14 0600 03/29/14 1300  BP: 117/67 120/74 144/67 145/71  Pulse: 75 73 66 89  Temp: 97.9 F (36.6 C) 98.4 F (36.9 C) 97.6 F (36.4 C) 97.9 F (36.6 C)  TempSrc: Oral Oral Oral Oral  Resp: 18 16 18 16   Height:      Weight:      SpO2: 95% 95% 95% 98%    General: Pt is alert, follows commands appropriately, not in acute distress, elderly gentleman with multiple Seb K's on face  Cardiovascular: Regular rate and rhythm, S1/S2, no murmurs  Respiratory: Clear to auscultation bilaterally, no wheezing, no crackles  Abdomen: Soft, non tender, non distended, bowel sounds present,  Extremities: No edema  Neuro: Grossly nonfocal   Discharge Instructions  Discharge Instructions   Discharge patient    Complete by:  As directed   Home            Medication List    Notice   You have not been prescribed any medications.        The results of significant diagnostics from this hospitalization (including imaging, microbiology, ancillary and laboratory) are listed below for reference.     Microbiology: No results found for this or any previous visit (from the past 240 hour(s)).   Labs: Basic Metabolic Panel:  Recent Labs Lab 03/27/14 1314 03/28/14 0536  NA 139 139  K 4.4 3.9  CL 102 103  CO2 27 27  GLUCOSE 105* 98  BUN 20 15  CREATININE 0.79 0.72  CALCIUM 8.6 8.1*   Liver Function Tests:  Recent Labs Lab 03/27/14 1314  AST 16  ALT 13  ALKPHOS 59  BILITOT 0.8  PROT 6.2  ALBUMIN 3.4*    CBC:  Recent Labs Lab 03/27/14 1314 03/27/14 1858 03/28/14 0536  WBC 8.2 7.4 5.7  HGB 8.7* 8.1* 7.1*  HCT 26.2* 24.2* 21.4*  MCV 94.2 93.8 95.1  PLT 184 179 143*     SIGNED: Time coordinating discharge:  35 minutes  Marvine Encalade, MD  [facility] 03/29/2014, 4:58 PM Pager [phone]  If 7PM-7AM, please contact night-coverage [email or web address] Password [email or web address]

## 2014-03-29 NOTE — Progress Notes (Signed)
Subjective: No further bleeding. No abdominal pain.  Objective: Vital signs in last 24 hours: Temp:  [97.6 F (36.4 C)-98.8 F (37.1 C)] 97.6 F (36.4 C) (07/13 0600) Pulse Rate:  [66-83] 66 (07/13 0600) Resp:  [11-20] 18 (07/13 0600) BP: (99-154)/(31-83) 144/67 mmHg (07/13 0600) SpO2:  [95 %-100 %] 95 % (07/13 0600) Weight change:  Last BM Date: 03/28/14  PE: GEN:  NAD ABD:  Soft  Lab Results: CBC    Component Value Date/Time   WBC 5.7 03/28/2014 0536   RBC 2.25* 03/28/2014 0536   HGB 7.1* 03/28/2014 0536   HCT 21.4* 03/28/2014 0536   PLT 143* 03/28/2014 0536   MCV 95.1 03/28/2014 0536   MCH 31.6 03/28/2014 0536   MCHC 33.2 03/28/2014 0536   RDW 13.0 03/28/2014 0536   CMP     Component Value Date/Time   NA 139 03/28/2014 0536   K 3.9 03/28/2014 0536   CL 103 03/28/2014 0536   CO2 27 03/28/2014 0536   GLUCOSE 98 03/28/2014 0536   BUN 15 03/28/2014 0536   CREATININE 0.72 03/28/2014 0536   CALCIUM 8.1* 03/28/2014 0536   PROT 6.2 03/27/2014 1314   ALBUMIN 3.4* 03/27/2014 1314   AST 16 03/27/2014 1314   ALT 13 03/27/2014 1314   ALKPHOS 59 03/27/2014 1314   BILITOT 0.8 03/27/2014 1314   GFRNONAA 88* 03/28/2014 0536   GFRAA >90 03/28/2014 0536   Assessment:  1.  Hematochezia.  Suspect diverticular. 2.  Anemia, acute blood loss.  2 units transfused yesterday, no post-tx CBC.  Plan:  1.  Abdominal ultrasound to assess for aortic aneurysm. 2.  If no significantly enlarged AAA is seen on today's ultrasound, would advance diet and discharge home today. 3.  Patient will need follow-up with me [phone]) a couple weeks after discharge.   Landry Dyke 03/29/2014, 9:48 AM

## 2014-04-01 ENCOUNTER — Ambulatory Visit: Payer: Self-pay | Admitting: Family Medicine

## 2014-04-06 ENCOUNTER — Inpatient Hospital Stay: Payer: Medicare Other | Admitting: Family Medicine

## 2014-04-22 ENCOUNTER — Ambulatory Visit (INDEPENDENT_AMBULATORY_CARE_PROVIDER_SITE_OTHER): Payer: Medicare Other | Admitting: Family Medicine

## 2014-04-22 ENCOUNTER — Encounter: Payer: Self-pay | Admitting: Family Medicine

## 2014-04-22 VITALS — BP 130/70 | HR 60 | Temp 98.0°F | Resp 12 | Ht 69.0 in | Wt 206.0 lb

## 2014-04-22 DIAGNOSIS — D62 Acute posthemorrhagic anemia: Secondary | ICD-10-CM

## 2014-04-22 DIAGNOSIS — Z125 Encounter for screening for malignant neoplasm of prostate: Secondary | ICD-10-CM | POA: Diagnosis not present

## 2014-04-22 DIAGNOSIS — Z0000 Encounter for general adult medical examination without abnormal findings: Secondary | ICD-10-CM

## 2014-04-22 DIAGNOSIS — D649 Anemia, unspecified: Secondary | ICD-10-CM | POA: Insufficient documentation

## 2014-04-22 DIAGNOSIS — D539 Nutritional anemia, unspecified: Secondary | ICD-10-CM | POA: Diagnosis not present

## 2014-04-22 DIAGNOSIS — E785 Hyperlipidemia, unspecified: Secondary | ICD-10-CM | POA: Diagnosis not present

## 2014-04-22 DIAGNOSIS — Z23 Encounter for immunization: Secondary | ICD-10-CM | POA: Diagnosis not present

## 2014-04-22 DIAGNOSIS — M546 Pain in thoracic spine: Secondary | ICD-10-CM | POA: Diagnosis not present

## 2014-04-22 DIAGNOSIS — Z87891 Personal history of nicotine dependence: Secondary | ICD-10-CM | POA: Insufficient documentation

## 2014-04-22 DIAGNOSIS — K5790 Diverticulosis of intestine, part unspecified, without perforation or abscess without bleeding: Secondary | ICD-10-CM | POA: Insufficient documentation

## 2014-04-22 LAB — COMPLETE METABOLIC PANEL WITH GFR
ALK PHOS: 65 U/L (ref 39–117)
ALT: 14 U/L (ref 0–53)
AST: 16 U/L (ref 0–37)
Albumin: 4.3 g/dL (ref 3.5–5.2)
BUN: 18 mg/dL (ref 6–23)
CO2: 27 mEq/L (ref 19–32)
CREATININE: 0.77 mg/dL (ref 0.50–1.35)
Calcium: 9.1 mg/dL (ref 8.4–10.5)
Chloride: 103 mEq/L (ref 96–112)
GFR, Est African American: >89 mL/min
GFR, Est Non African American: 88 mL/min
GLUCOSE: 88 mg/dL (ref 70–99)
Potassium: 4.3 mEq/L (ref 3.5–5.3)
Sodium: 138 mEq/L (ref 135–145)
Total Bilirubin: 0.8 mg/dL (ref 0.2–1.2)
Total Protein: 7 g/dL (ref 6.0–8.3)

## 2014-04-22 LAB — ANEMIA PANEL
%SAT: 9 % — AB (ref 20–55)
ABS Retic: 74.9 10*3/uL (ref 19.0–186.0)
Ferritin: 58 ng/mL (ref 22–322)
Folate: 14.8 ng/mL
IRON: 37 ug/dL — AB (ref 42–165)
RBC.: 3.94 MIL/uL — ABNORMAL LOW (ref 4.22–5.81)
RETIC CT PCT: 1.9 % (ref 0.4–2.3)
TIBC: 392 ug/dL (ref 215–435)
UIBC: 355 ug/dL (ref 125–400)
Vitamin B-12: 248 pg/mL (ref 211–911)

## 2014-04-22 LAB — CBC WITH DIFFERENTIAL/PLATELET
Basophils Absolute: 0 10*3/uL (ref 0.0–0.1)
Basophils Relative: 0 % (ref 0–1)
Eosinophils Absolute: 0.1 10*3/uL (ref 0.0–0.7)
Eosinophils Relative: 1 % (ref 0–5)
HCT: 36.4 % — ABNORMAL LOW (ref 39.0–52.0)
HEMOGLOBIN: 12.4 g/dL — AB (ref 13.0–17.0)
Lymphocytes Relative: 22 % (ref 12–46)
Lymphs Abs: 1.5 10*3/uL (ref 0.7–4.0)
MCH: 31.5 pg (ref 26.0–34.0)
MCHC: 34.1 g/dL (ref 30.0–36.0)
MCV: 92.4 fL (ref 78.0–100.0)
MONO ABS: 0.5 10*3/uL (ref 0.1–1.0)
MONOS PCT: 7 % (ref 3–12)
Neutro Abs: 4.8 10*3/uL (ref 1.7–7.7)
Neutrophils Relative %: 70 % (ref 43–77)
Platelets: 231 10*3/uL (ref 150–400)
RBC: 3.94 MIL/uL — ABNORMAL LOW (ref 4.22–5.81)
RDW: 14.2 % (ref 11.5–15.5)
WBC: 6.8 10*3/uL (ref 4.0–10.5)

## 2014-04-22 LAB — LIPID PANEL
CHOL/HDL RATIO: 4.3 ratio
CHOLESTEROL: 204 mg/dL — AB (ref 0–200)
HDL: 47 mg/dL (ref >39)
LDL Cholesterol: 139 mg/dL — ABNORMAL HIGH (ref 0–99)
Triglycerides: 88 mg/dL (ref <150)
VLDL: 18 mg/dL (ref 0–40)

## 2014-04-22 NOTE — Addendum Note (Signed)
Addended by: Shary Decamp B on: 04/22/2014 11:11 AM   Modules accepted: Orders

## 2014-04-22 NOTE — Progress Notes (Signed)
Subjective:    Patient ID: Christopher Bowen, male    DOB: 07-08-1935, 79 y.o.   MRN: [id number]  HPI Patient is here for a complete physical exam. I have not seen the patient in over 3 years. Unfortunately, the patient recently was hospitalized with acute blood loss from diverticular bleeding. Patient had a colonoscopy performed in the hospital which revealed diverticulosis along with colon polyps. The patient's bleeding stopped spontaneously and he was discharged home. His hemoglobin on discharge was 7.1. No one has checked his blood counts since. He denies any melena or hematochezia. He denies any shortness of breath or chest pain. He denies any syncope. He does report fatigue. Overall he is doing well. He is overdue for a PSA. Patient has had Pneumovax 23. He is overdue for Prevnar 13.  He does complain of low back pain and midthoracic back pain for 2-3 months. The pain begins around the level of L1 and radiates up his spine and behind his right shoulder blade. It is worse with prolonged standing. It is worse with movement. He denies any neuropathic pain in his arms or legs. He denies any injury. Past Medical History  Diagnosis Date  . Diverticulosis   . Anemia   . Former smoker    Past Surgical History  Procedure Laterality Date  . Colonoscopy Left 03/28/2014    Procedure: COLONOSCOPY;  Surgeon: Arta Silence, MD;  Location: [facility];  Service: Endoscopy;  Laterality: Left;   No current outpatient prescriptions on file prior to visit.   No current facility-administered medications on file prior to visit.   Allergies  Allergen Reactions  . Penicillins Anaphylaxis   History   Social History  . Marital Status: Unknown    Spouse Name: N/A    Number of Children: N/A  . Years of Education: N/A   Occupational History  . Not on file.   Social History Main Topics  . Smoking status: Former Smoker -- 0.50 packs/day    Types: Cigarettes    Quit date: 09/17/1968  . Smokeless tobacco:  Never Used  . Alcohol Use: No  . Drug Use: No  . Sexual Activity: Not on file   Other Topics Concern  . Not on file   Social History Narrative   Lives alone, and does not use assist device, no home health services. 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     Family History  Problem Relation Age of Onset  . Colon cancer Neg Hx   . Ulcerative colitis Neg Hx   . Crohn's disease Neg Hx   . Diabetes Mother   . Cerebral aneurysm Mother   . Heart disease Father     age 77  . Diabetes Brother       Review of Systems  All other systems reviewed and are negative.      Objective:   Physical Exam  Vitals reviewed. Constitutional: He is oriented to person, place, and time. He appears well-developed and well-nourished. No distress.  HENT:  Head: Normocephalic and atraumatic.  Right Ear: External ear normal.  Left Ear: External ear normal.  Nose: Nose normal.  Mouth/Throat: Oropharynx is clear and moist. No oropharyngeal exudate.  Eyes: Conjunctivae and EOM are normal. Pupils are equal, round, and reactive to light. Right eye exhibits no discharge. Left eye exhibits no discharge. No scleral icterus.  Neck: Normal range of motion. Neck supple. No JVD present. No tracheal deviation present. No thyromegaly present.  Cardiovascular: Normal rate, regular rhythm, normal heart  sounds and intact distal pulses.  Exam reveals no gallop and no friction rub.   No murmur heard. Pulmonary/Chest: Effort normal and breath sounds normal. No respiratory distress. He has no wheezes. He has no rales. He exhibits no tenderness.  Abdominal: Soft. Bowel sounds are normal. He exhibits no distension and no mass. There is no tenderness. There is no rebound and no guarding.  Musculoskeletal: He exhibits no edema and no tenderness.       Thoracic back: He exhibits pain. He exhibits normal range of motion, no tenderness and no spasm.       Lumbar back: He exhibits decreased range of motion and pain. He exhibits no  tenderness, no bony tenderness and no spasm.  Lymphadenopathy:    He has no cervical adenopathy.  Neurological: He is alert and oriented to person, place, and time. He has normal reflexes. He displays normal reflexes. No cranial nerve deficit. He exhibits normal muscle tone. Coordination normal.  Skin: Skin is warm. No rash noted. He is not diaphoretic. No erythema. No pallor.  Psychiatric: He has a normal mood and affect. His behavior is normal. Judgment and thought content normal.          Assessment & Plan:  1. Right-sided thoracic back pain Begin the workup of his back pain back pain x-rays of his thoracic and lumbar spine. - DG Thoracic Spine W/Swimmers; Future - DG Lumbar Spine Complete; Future  2. Routine general medical examination at a health care facility  physical exam today is completely normal. I will check a CMP along with a fasting lipid panel. I also recommended the patient receive Prevnar 13. - COMPLETE METABOLIC PANEL WITH GFR - Lipid panel  3. Screening for prostate cancer - PSA, Medicare  4. Acute blood loss anemia I will check a CBC along with anemia panel. If the patient's hemoglobin is stable or improving I will likely start the patient on iron replacement therapy using iron sulfate 325 mg by mouth today until his CBC has normalized. - CBC with Differential - Anemia panel

## 2014-04-23 LAB — PSA, MEDICARE: PSA: 0.75 ng/mL (ref <=4.00)

## 2014-04-26 ENCOUNTER — Ambulatory Visit: Payer: Self-pay | Admitting: Family Medicine

## 2014-05-14 ENCOUNTER — Ambulatory Visit (INDEPENDENT_AMBULATORY_CARE_PROVIDER_SITE_OTHER): Payer: Medicare Other | Admitting: General Surgery

## 2014-06-02 ENCOUNTER — Ambulatory Visit (INDEPENDENT_AMBULATORY_CARE_PROVIDER_SITE_OTHER): Payer: Medicare Other | Admitting: General Surgery

## 2014-06-21 DIAGNOSIS — K629 Disease of anus and rectum, unspecified: Secondary | ICD-10-CM | POA: Diagnosis not present

## 2015-06-21 ENCOUNTER — Encounter: Payer: Self-pay | Admitting: Family Medicine

## 2015-06-21 ENCOUNTER — Ambulatory Visit (INDEPENDENT_AMBULATORY_CARE_PROVIDER_SITE_OTHER): Payer: Medicare Other | Admitting: Family Medicine

## 2015-06-21 VITALS — BP 162/82 | HR 60 | Temp 98.2°F | Resp 16 | Wt 209.0 lb

## 2015-06-21 DIAGNOSIS — I10 Essential (primary) hypertension: Secondary | ICD-10-CM | POA: Diagnosis not present

## 2015-06-21 DIAGNOSIS — Z23 Encounter for immunization: Secondary | ICD-10-CM | POA: Diagnosis not present

## 2015-06-21 LAB — COMPLETE METABOLIC PANEL WITH GFR
ALK PHOS: 60 U/L (ref 40–115)
ALT: 13 U/L (ref 9–46)
AST: 18 U/L (ref 10–35)
Albumin: 4.1 g/dL (ref 3.6–5.1)
BUN: 16 mg/dL (ref 7–25)
CALCIUM: 9.1 mg/dL (ref 8.6–10.3)
CO2: 29 mmol/L (ref 20–31)
Chloride: 100 mmol/L (ref 98–110)
Creat: 0.86 mg/dL (ref 0.70–1.18)
GFR, EST NON AFRICAN AMERICAN: 83 mL/min (ref >=60)
GFR, Est African American: >89 mL/min (ref >=60)
GLUCOSE: 89 mg/dL (ref 70–99)
POTASSIUM: 4.5 mmol/L (ref 3.5–5.3)
SODIUM: 136 mmol/L (ref 135–146)
Total Bilirubin: 1 mg/dL (ref 0.2–1.2)
Total Protein: 7.2 g/dL (ref 6.1–8.1)

## 2015-06-21 LAB — CBC WITH DIFFERENTIAL/PLATELET
BASOS ABS: 0 10*3/uL (ref 0.0–0.1)
Basophils Relative: 0 % (ref 0–1)
EOS ABS: 0.1 10*3/uL (ref 0.0–0.7)
Eosinophils Relative: 2 % (ref 0–5)
HCT: 42.6 % (ref 39.0–52.0)
Hemoglobin: 14.8 g/dL (ref 13.0–17.0)
LYMPHS ABS: 1.7 10*3/uL (ref 0.7–4.0)
Lymphocytes Relative: 26 % (ref 12–46)
MCH: 31.4 pg (ref 26.0–34.0)
MCHC: 34.7 g/dL (ref 30.0–36.0)
MCV: 90.4 fL (ref 78.0–100.0)
MPV: 12.2 fL (ref 8.6–12.4)
Monocytes Absolute: 0.5 10*3/uL (ref 0.1–1.0)
Monocytes Relative: 8 % (ref 3–12)
NEUTROS ABS: 4.3 10*3/uL (ref 1.7–7.7)
NEUTROS PCT: 64 % (ref 43–77)
Platelets: 207 10*3/uL (ref 150–400)
RBC: 4.71 MIL/uL (ref 4.22–5.81)
RDW: 13.7 % (ref 11.5–15.5)
WBC: 6.7 10*3/uL (ref 4.0–10.5)

## 2015-06-21 LAB — LIPID PANEL
CHOL/HDL RATIO: 4.6 ratio (ref <=5.0)
Cholesterol: 216 mg/dL — ABNORMAL HIGH (ref 125–200)
HDL: 47 mg/dL (ref >=40)
LDL Cholesterol: 144 mg/dL — ABNORMAL HIGH (ref <130)
Triglycerides: 123 mg/dL (ref <150)
VLDL: 25 mg/dL (ref <30)

## 2015-06-21 MED ORDER — LISINOPRIL-HYDROCHLOROTHIAZIDE 20-12.5 MG PO TABS: 1.0000 | ORAL_TABLET | Freq: Every day | ORAL | Status: DC

## 2015-06-21 NOTE — Addendum Note (Signed)
Addended by: Shary Decamp B on: 06/21/2015 10:51 AM   Modules accepted: Orders

## 2015-06-21 NOTE — Progress Notes (Signed)
   Subjective:    Patient ID: Christopher Bowen, male    DOB: 11-Apr-1935, 80 y.o.   MRN: [id number]  HPI  Last month or so, patient has been experiencing hypertension. His blood pressures frequently greater than 160/80. He is checking it regularly with his blood pressure cuff at home. He denies any chest pain shortness of breath or dyspnea on exertion. I checked his blood pressure today manually and found his blood pressure to be elevated at 162/82. He denies any dietary changes or medication changes. He denies any pain or anxiety. Past Medical History  Diagnosis Date  . Diverticulosis   . Anemia   . Former smoker    Past Surgical History  Procedure Laterality Date  . Colonoscopy Left 03/28/2014    Procedure: COLONOSCOPY;  Surgeon: Arta Silence, MD;  Location: [facility];  Service: Endoscopy;  Laterality: Left;   No current outpatient prescriptions on file prior to visit.   No current facility-administered medications on file prior to visit.   Allergies  Allergen Reactions  . Penicillins Anaphylaxis   Social History   Social History  . Marital Status: Unknown    Spouse Name: N/A  . Number of Children: N/A  . Years of Education: N/A   Occupational History  . Not on file.   Social History Main Topics  . Smoking status: Former Smoker -- 0.50 packs/day    Types: Cigarettes    Quit date: 09/17/1968  . Smokeless tobacco: Never Used  . Alcohol Use: No  . Drug Use: No  . Sexual Activity: Not on file   Other Topics Concern  . Not on file   Social History Narrative   Lives alone, and does not use assist device, no home health services. 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       Review of Systems  All other systems reviewed and are negative.      Objective:   Physical Exam  Constitutional: He appears well-developed and well-nourished.  Neck: No JVD present.  Cardiovascular: Normal rate, regular rhythm and normal heart sounds.   Pulmonary/Chest: Effort normal and breath  sounds normal. No respiratory distress. He has no wheezes. He has no rales.  Musculoskeletal: He exhibits no edema.  Vitals reviewed.         Assessment & Plan:  Benign essential HTN - Plan: lisinopril-hydrochlorothiazide (ZESTORETIC) 20-12.5 MG tablet, CBC with Differential, Lipid panel, COMPLETE METABOLIC PANEL WITH GFR  Blood pressures elevated. Begin Zestoretic 20/12.5 one by mouth daily. I will also check a CBC, CMP, fasting lipid panel. I would like to see the patient back in one month to recheck his blood pressure. I also gave the patient a flu shot today.

## 2015-07-29 ENCOUNTER — Ambulatory Visit (INDEPENDENT_AMBULATORY_CARE_PROVIDER_SITE_OTHER): Payer: Medicare Other | Admitting: Family Medicine

## 2015-07-29 ENCOUNTER — Encounter: Payer: Self-pay | Admitting: Family Medicine

## 2015-07-29 VITALS — BP 142/78 | HR 62 | Temp 98.0°F | Resp 16 | Ht 69.0 in | Wt 204.0 lb

## 2015-07-29 DIAGNOSIS — R0789 Other chest pain: Secondary | ICD-10-CM | POA: Diagnosis not present

## 2015-07-29 DIAGNOSIS — I10 Essential (primary) hypertension: Secondary | ICD-10-CM | POA: Diagnosis not present

## 2015-07-29 MED ORDER — LISINOPRIL-HYDROCHLOROTHIAZIDE 20-12.5 MG PO TABS: 2.0000 | ORAL_TABLET | Freq: Every day | ORAL | Status: DC

## 2015-07-29 NOTE — Progress Notes (Signed)
Subjective:    Patient ID: Christopher Bowen, male    DOB: November 17, 1934, 80 y.o.   MRN: [id number]  HPI  06/21/15 Last month or so, patient has been experiencing hypertension. His blood pressures frequently greater than 160/80. He is checking it regularly with his blood pressure cuff at home. He denies any chest pain shortness of breath or dyspnea on exertion. I checked his blood pressure today manually and found his blood pressure to be elevated at 162/82. He denies any dietary changes or medication changes. He denies any pain or anxiety.  At that time, my plan was: Blood pressure is elevated. Begin Zestoretic 20/12.5 one by mouth daily. I will also check a CBC, CMP, fasting lipid panel. I would like to see the patient back in one month to recheck his blood pressure. I also gave the patient a flu shot today.  No visits with results within 1 Month(s) from this visit. Latest known visit with results is:  Office Visit on 06/21/2015  Component Date Value Ref Range Status  . WBC 06/21/2015 6.7  4.0 - 10.5 K/uL Final  . RBC 06/21/2015 4.71  4.22 - 5.81 MIL/uL Final  . Hemoglobin 06/21/2015 14.8  13.0 - 17.0 g/dL Final  . HCT 06/21/2015 42.6  39.0 - 52.0 % Final  . MCV 06/21/2015 90.4  78.0 - 100.0 fL Final  . MCH 06/21/2015 31.4  26.0 - 34.0 pg Final  . MCHC 06/21/2015 34.7  30.0 - 36.0 g/dL Final  . RDW 06/21/2015 13.7  11.5 - 15.5 % Final  . Platelets 06/21/2015 207  150 - 400 K/uL Final  . MPV 06/21/2015 12.2  8.6 - 12.4 fL Final  . Neutrophils Relative % 06/21/2015 64  43 - 77 % Final  . Neutro Abs 06/21/2015 4.3  1.7 - 7.7 K/uL Final  . Lymphocytes Relative 06/21/2015 26  12 - 46 % Final  . Lymphs Abs 06/21/2015 1.7  0.7 - 4.0 K/uL Final  . Monocytes Relative 06/21/2015 8  3 - 12 % Final  . Monocytes Absolute 06/21/2015 0.5  0.1 - 1.0 K/uL Final  . Eosinophils Relative 06/21/2015 2  0 - 5 % Final  . Eosinophils Absolute 06/21/2015 0.1  0.0 - 0.7 K/uL Final  . Basophils Relative 06/21/2015 0   0 - 1 % Final  . Basophils Absolute 06/21/2015 0.0  0.0 - 0.1 K/uL Final  . Smear Review 06/21/2015 Criteria for review not met   Final  . Cholesterol 06/21/2015 216* 125 - 200 mg/dL Final  . Triglycerides 06/21/2015 123  <150 mg/dL Final  . HDL 06/21/2015 47  >=40 mg/dL Final  . Total CHOL/HDL Ratio 06/21/2015 4.6  <=5.0 Ratio Final  . VLDL 06/21/2015 25  <30 mg/dL Final  . LDL Cholesterol 06/21/2015 144* <130 mg/dL Final   Comment:   Total Cholesterol/HDL Ratio:CHD Risk                        Coronary Heart Disease Risk Table                                        Men       Women          1/2 Average Risk              3.4        3.3  Average Risk              5.0        4.4           2X Average Risk              9.6        7.1           3X Average Risk             23.4       11.0 Use the calculated Patient Ratio above and the CHD Risk table  to determine the patient's CHD Risk.   . Sodium 06/21/2015 136  135 - 146 mmol/L Final  . Potassium 06/21/2015 4.5  3.5 - 5.3 mmol/L Final  . Chloride 06/21/2015 100  98 - 110 mmol/L Final  . CO2 06/21/2015 29  20 - 31 mmol/L Final  . Glucose, Bld 06/21/2015 89  70 - 99 mg/dL Final  . BUN 06/21/2015 16  7 - 25 mg/dL Final  . Creat 06/21/2015 0.86  0.70 - 1.18 mg/dL Final  . Total Bilirubin 06/21/2015 1.0  0.2 - 1.2 mg/dL Final  . Alkaline Phosphatase 06/21/2015 60  40 - 115 U/L Final  . AST 06/21/2015 18  10 - 35 U/L Final  . ALT 06/21/2015 13  9 - 46 U/L Final  . Total Protein 06/21/2015 7.2  6.1 - 8.1 g/dL Final  . Albumin 06/21/2015 4.1  3.6 - 5.1 g/dL Final  . Calcium 06/21/2015 9.1  8.6 - 10.3 mg/dL Final  . GFR, Est African American 06/21/2015 >89  >=60 mL/min Final  . GFR, Est Non African American 06/21/2015 83  >=60 mL/min Final   Comment:   The estimated GFR is a calculation valid for adults (>=79 years old) that uses the CKD-EPI algorithm to adjust for age and sex. It is   not to be used for children, pregnant women,  hospitalized patients,    patients on dialysis, or with rapidly changing kidney function. According to the NKDEP, eGFR >89 is normal, 60-89 shows mild impairment, 30-59 shows moderate impairment, 15-29 shows severe impairment and <15 is ESRD.     07/29/15 Blood pressure at home is still elevated at 150/80. He denies any chest pain. However he does have posterior right-sided chest pain. It has been there for months. It aches and throbs. It is located just below the level of the scapula in between the bodies of the ribs. He denies any cough or dyspnea or shortness of breath or hemoptysis  Past Medical History  Diagnosis Date  . Diverticulosis   . Anemia   . Former smoker    Past Surgical History  Procedure Laterality Date  . Colonoscopy Left 03/28/2014    Procedure: COLONOSCOPY;  Surgeon: Arta Silence, MD;  Location: [facility];  Service: Endoscopy;  Laterality: Left;   Current Outpatient Prescriptions on File Prior to Visit  Medication Sig Dispense Refill  . lisinopril-hydrochlorothiazide (ZESTORETIC) 20-12.5 MG tablet Take 1 tablet by mouth daily. 30 tablet 5   No current facility-administered medications on file prior to visit.   Allergies  Allergen Reactions  . Penicillins Anaphylaxis   Social History   Social History  . Marital Status: Unknown    Spouse Name: N/A  . Number of Children: N/A  . Years of Education: N/A   Occupational History  . Not on file.   Social History Main Topics  . Smoking status: Former Smoker -- 0.50 packs/day  Types: Cigarettes    Quit date: 09/17/1968  . Smokeless tobacco: Never Used  . Alcohol Use: No  . Drug Use: No  . Sexual Activity: Not on file   Other Topics Concern  . Not on file   Social History Narrative   Lives alone, and does not use assist device, no home health services. 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       Review of Systems  All other systems reviewed and are negative.      Objective:   Physical Exam    Constitutional: He appears well-developed and well-nourished.  Neck: No JVD present.  Cardiovascular: Normal rate, regular rhythm and normal heart sounds.   Pulmonary/Chest: Effort normal and breath sounds normal. No respiratory distress. He has no wheezes. He has no rales.  Musculoskeletal: He exhibits no edema.  Vitals reviewed.         Assessment & Plan:  Benign essential HTN - Plan: lisinopril-hydrochlorothiazide (ZESTORETIC) 20-12.5 MG tablet  Right-sided chest wall pain - Plan: DG Chest 2 View  Increase Zestoretic to 40/25 by mouth daily. Recheck blood pressure in one month. Return fasting for fasting lipid panel in 3 months to recheck his cholesterol fish oil which he is currently taking due to his LDL being slightly elevated. I would also like the patient to go for a chest x-ray due to his symptoms. I believe he likely has an intercostal muscle strain or muscular back pain. However I want to rule out more significant abnormalities in the chest.

## 2015-08-30 ENCOUNTER — Encounter: Payer: Self-pay | Admitting: *Deleted

## 2015-10-14 ENCOUNTER — Encounter: Payer: Self-pay | Admitting: Family Medicine

## 2015-10-14 ENCOUNTER — Other Ambulatory Visit: Payer: Self-pay | Admitting: Family Medicine

## 2015-10-14 DIAGNOSIS — I10 Essential (primary) hypertension: Secondary | ICD-10-CM

## 2015-10-14 DIAGNOSIS — Z79899 Other long term (current) drug therapy: Secondary | ICD-10-CM

## 2015-10-14 DIAGNOSIS — E785 Hyperlipidemia, unspecified: Secondary | ICD-10-CM

## 2015-10-14 MED ORDER — LISINOPRIL-HYDROCHLOROTHIAZIDE 20-12.5 MG PO TABS: 2.0000 | ORAL_TABLET | Freq: Every day | ORAL | Status: DC

## 2015-10-14 NOTE — Telephone Encounter (Signed)
Medication refilled per protocol. 

## 2015-12-06 ENCOUNTER — Telehealth: Payer: Self-pay | Admitting: Family Medicine

## 2015-12-06 NOTE — Telephone Encounter (Signed)
[phone] Christopher Bowen calling to talk about the lisinopril and maybe making him cough?

## 2015-12-07 NOTE — Telephone Encounter (Signed)
[facility] 12/07/15

## 2016-02-28 ENCOUNTER — Telehealth: Payer: Self-pay | Admitting: Family Medicine

## 2016-02-28 DIAGNOSIS — I10 Essential (primary) hypertension: Secondary | ICD-10-CM

## 2016-02-28 NOTE — Telephone Encounter (Signed)
Pt's daughter is calling for a refill of Lisinopril. He is out as of today and she is afraid that his blood preasure will go back up. Please call Janett Billow when called in [phone]

## 2016-02-29 MED ORDER — LISINOPRIL-HYDROCHLOROTHIAZIDE 20-12.5 MG PO TABS: 2.0000 | ORAL_TABLET | Freq: Every day | ORAL | Status: DC

## 2016-02-29 NOTE — Telephone Encounter (Signed)
Medication called/sent to requested pharmacy and Janett Billow aware

## 2016-05-03 ENCOUNTER — Telehealth: Payer: Self-pay | Admitting: Family Medicine

## 2016-05-03 NOTE — Telephone Encounter (Signed)
Pt lost current 90 day supply bottle of Zestoretic.  Does have 1 refill left.  Pt pays cash does not have prescription insurance.  Told daughter to call pharmacy and ask for next refill early, explain what happened.  They should be able to fill as there will be no insurance roadblocks.  If not call me back.

## 2017-02-15 ENCOUNTER — Ambulatory Visit (INDEPENDENT_AMBULATORY_CARE_PROVIDER_SITE_OTHER): Payer: Medicare Other | Admitting: Family Medicine

## 2017-02-15 ENCOUNTER — Other Ambulatory Visit: Payer: Self-pay | Admitting: Family Medicine

## 2017-02-15 ENCOUNTER — Encounter: Payer: Self-pay | Admitting: Family Medicine

## 2017-02-15 VITALS — BP 126/68 | HR 78 | Temp 98.1°F | Resp 18 | Ht 69.0 in | Wt 202.0 lb

## 2017-02-15 DIAGNOSIS — H66001 Acute suppurative otitis media without spontaneous rupture of ear drum, right ear: Secondary | ICD-10-CM

## 2017-02-15 DIAGNOSIS — I10 Essential (primary) hypertension: Secondary | ICD-10-CM

## 2017-02-15 DIAGNOSIS — R7309 Other abnormal glucose: Secondary | ICD-10-CM | POA: Diagnosis not present

## 2017-02-15 MED ORDER — LEVOFLOXACIN 500 MG PO TABS: 500.0000 mg | ORAL_TABLET | Freq: Every day | ORAL | 0 refills | Status: DC

## 2017-02-15 MED ORDER — LISINOPRIL-HYDROCHLOROTHIAZIDE 20-12.5 MG PO TABS: 2.0000 | ORAL_TABLET | Freq: Every day | ORAL | 3 refills | Status: DC

## 2017-02-15 NOTE — Progress Notes (Signed)
Subjective:    Patient ID: Christopher Bowen, male    DOB: March 14, 1935, 82 y.o.   MRN: [id number]  HPI Patient has not been seen in more than a year. Past medical history is significant for hypertension, acute anemia secondary to GI bleed, thrombocytopenia, and history of smoking cessation. He is currently on lisinopril/hydrochlorothiazide for hypertension. His blood pressure today is well controlled. He denies any chest pain shortness of breath or dyspnea on exertion. He is overdue for lab work including a CBC to monitor his anemia as well as a CMP to monitor his renal function and potassium on his antihypertensive medication. He states that he is also had moderate to severe pain in his right ear for more than a week. He denies any trauma. He does report decreased hearing in the right ear worse than chronic but he does have chronic hearing loss as well. He denies any fever. He denies any recent upper respiratory tract infection. On examination today, the right tympanic membrane is quite remarkable. The tympanic membrane is erythematous. There is blood in the auditory canal emanating from the surface of the tympanic membrane. There is an effusion. The effusion has obscured the landmarks behind the TM.  Past Medical History:  Diagnosis Date  . Anemia   . Diverticulosis   . Former smoker    Past Surgical History:  Procedure Laterality Date  . COLONOSCOPY Left 03/28/2014   Procedure: COLONOSCOPY;  Surgeon: Arta Silence, MD;  Location: [facility];  Service: Endoscopy;  Laterality: Left;   No current outpatient prescriptions on file prior to visit.   No current facility-administered medications on file prior to visit.    Allergies  Allergen Reactions  . Penicillins Anaphylaxis   Social History   Social History  . Marital status: Unknown    Spouse name: N/A  . Number of children: N/A  . Years of education: N/A   Occupational History  . Not on file.   Social History Main Topics  .  Smoking status: Former Smoker    Packs/day: 0.50    Types: Cigarettes    Quit date: 09/17/1968  . Smokeless tobacco: Never Used  . Alcohol use No  . Drug use: No  . Sexual activity: Not on file   Other Topics Concern  . Not on file   Social History Narrative   Lives alone, and does not use assist device, no home health services. 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        Review of Systems  All other systems reviewed and are negative.      Objective:   Physical Exam  Constitutional: He appears well-developed and well-nourished.  HENT:  Right Ear: Tympanic membrane is injected, erythematous and bulging. Tympanic membrane mobility is abnormal. A middle ear effusion is present.  Left Ear: Tympanic membrane and ear canal normal.  Neck: Neck supple. No JVD present. No thyromegaly present.  Cardiovascular: Normal rate, regular rhythm and normal heart sounds.   No murmur heard. Pulmonary/Chest: Effort normal and breath sounds normal. No respiratory distress. He has no wheezes. He has no rales.  Abdominal: Soft. Bowel sounds are normal. He exhibits no distension. There is no tenderness. There is no rebound and no guarding.  Musculoskeletal: He exhibits no edema.  Lymphadenopathy:    He has no cervical adenopathy.  Vitals reviewed.         Assessment & Plan:  Benign essential HTN - Plan: CBC with Differential/Platelet, COMPLETE METABOLIC PANEL WITH GFR, lisinopril-hydrochlorothiazide (ZESTORETIC) 20-12.5  MG tablet  Acute suppurative otitis media of right ear without spontaneous rupture of tympanic membrane, recurrence not specified  Blood pressure today is well controlled. Check CMP to monitor renal function along with potassium. I will make no change in his dose of Zestoretic. Immunizations are up-to-date. Due to his age, he is no longer recommended to have a colonoscopy or prostate cancer screening. Check a CBC to monitor his anemia and thrombocytopenia. I am quite surprised by  his otitis media. Treat the patient with Levaquin 500 mg by mouth daily for 7 days and recheck next week to ensure resolution or sooner if worse

## 2017-02-16 LAB — CBC WITH DIFFERENTIAL/PLATELET
Basophils Absolute: 0 cells/uL (ref 0–200)
Basophils Relative: 0 %
Eosinophils Absolute: 276 cells/uL (ref 15–500)
Eosinophils Relative: 3 %
HCT: 41.9 % (ref 38.5–50.0)
Hemoglobin: 13.9 g/dL (ref 13.0–17.0)
LYMPHS ABS: 1748 {cells}/uL (ref 850–3900)
Lymphocytes Relative: 19 %
MCH: 31 pg (ref 27.0–33.0)
MCHC: 33.2 g/dL (ref 32.0–36.0)
MCV: 93.3 fL (ref 80.0–100.0)
MONOS PCT: 6 %
MPV: 11.6 fL (ref 7.5–12.5)
Monocytes Absolute: 552 cells/uL (ref 200–950)
NEUTROS ABS: 6624 {cells}/uL (ref 1500–7800)
Neutrophils Relative %: 72 %
PLATELETS: 258 10*3/uL (ref 140–400)
RBC: 4.49 MIL/uL (ref 4.20–5.80)
RDW: 13.3 % (ref 11.0–15.0)
WBC: 9.2 10*3/uL (ref 3.8–10.8)

## 2017-02-16 LAB — COMPLETE METABOLIC PANEL WITH GFR
ALT: 14 U/L (ref 9–46)
AST: 16 U/L (ref 10–35)
Albumin: 3.9 g/dL (ref 3.6–5.1)
Alkaline Phosphatase: 83 U/L (ref 40–115)
BILIRUBIN TOTAL: 0.7 mg/dL (ref 0.2–1.2)
BUN: 20 mg/dL (ref 7–25)
CO2: 25 mmol/L (ref 20–31)
Calcium: 9.5 mg/dL (ref 8.6–10.3)
Chloride: 101 mmol/L (ref 98–110)
Creat: 1.05 mg/dL (ref 0.70–1.18)
GFR, Est African American: 78 mL/min (ref >=60)
GFR, Est Non African American: 67 mL/min (ref >=60)
GLUCOSE: 125 mg/dL — AB (ref 70–99)
Potassium: 4 mmol/L (ref 3.5–5.3)
SODIUM: 139 mmol/L (ref 135–146)
TOTAL PROTEIN: 7.1 g/dL (ref 6.1–8.1)

## 2017-02-20 LAB — HEMOGLOBIN A1C
Hgb A1c MFr Bld: 5.5 % (ref <5.7)
MEAN PLASMA GLUCOSE: 111 mg/dL

## 2017-02-25 ENCOUNTER — Other Ambulatory Visit: Payer: Self-pay | Admitting: Family Medicine

## 2017-02-25 DIAGNOSIS — E785 Hyperlipidemia, unspecified: Secondary | ICD-10-CM

## 2017-02-26 ENCOUNTER — Other Ambulatory Visit: Payer: Medicare Other

## 2017-04-10 ENCOUNTER — Telehealth: Payer: Self-pay | Admitting: Family Medicine

## 2017-04-10 DIAGNOSIS — I10 Essential (primary) hypertension: Secondary | ICD-10-CM

## 2017-04-10 MED ORDER — LISINOPRIL-HYDROCHLOROTHIAZIDE 20-12.5 MG PO TABS: 2.0000 | ORAL_TABLET | Freq: Every day | ORAL | 3 refills | Status: DC

## 2017-04-10 NOTE — Telephone Encounter (Signed)
Medication called/sent to requested pharmacy  

## 2017-04-10 NOTE — Telephone Encounter (Signed)
Patient is in [place] and has forgot his lisinopril, would like to know if this can be called in to the [facility] on [address]  Phone number is [phone] Has not taken this for 2 days

## 2017-05-31 ENCOUNTER — Encounter: Payer: Self-pay | Admitting: Family Medicine

## 2017-07-18 ENCOUNTER — Encounter: Payer: Self-pay | Admitting: Family Medicine

## 2017-09-30 ENCOUNTER — Ambulatory Visit: Payer: Medicare Other | Admitting: Family Medicine

## 2017-10-01 ENCOUNTER — Ambulatory Visit: Payer: Medicare Other | Admitting: Family Medicine

## 2017-10-04 ENCOUNTER — Encounter: Payer: Self-pay | Admitting: Family Medicine

## 2017-10-04 ENCOUNTER — Ambulatory Visit (INDEPENDENT_AMBULATORY_CARE_PROVIDER_SITE_OTHER): Payer: Medicare Other | Admitting: Family Medicine

## 2017-10-04 VITALS — BP 118/60 | HR 66 | Temp 98.1°F | Resp 14 | Ht 69.0 in | Wt 206.0 lb

## 2017-10-04 DIAGNOSIS — M542 Cervicalgia: Secondary | ICD-10-CM

## 2017-10-04 DIAGNOSIS — I10 Essential (primary) hypertension: Secondary | ICD-10-CM | POA: Diagnosis not present

## 2017-10-04 DIAGNOSIS — Z23 Encounter for immunization: Secondary | ICD-10-CM

## 2017-10-04 DIAGNOSIS — D696 Thrombocytopenia, unspecified: Secondary | ICD-10-CM | POA: Diagnosis not present

## 2017-10-04 DIAGNOSIS — R053 Chronic cough: Secondary | ICD-10-CM

## 2017-10-04 DIAGNOSIS — R05 Cough: Secondary | ICD-10-CM | POA: Diagnosis not present

## 2017-10-04 DIAGNOSIS — R5382 Chronic fatigue, unspecified: Secondary | ICD-10-CM

## 2017-10-04 LAB — COMPLETE METABOLIC PANEL WITH GFR
AG Ratio: 1.4 (calc) (ref 1.0–2.5)
ALKALINE PHOSPHATASE (APISO): 60 U/L (ref 40–115)
ALT: 15 U/L (ref 9–46)
AST: 30 U/L (ref 10–35)
Albumin: 4.2 g/dL (ref 3.6–5.1)
BUN: 19 mg/dL (ref 7–25)
CALCIUM: 9.3 mg/dL (ref 8.6–10.3)
CO2: 26 mmol/L (ref 20–32)
Chloride: 94 mmol/L — ABNORMAL LOW (ref 98–110)
Creat: 1.02 mg/dL (ref 0.70–1.11)
GFR, EST NON AFRICAN AMERICAN: 69 mL/min/{1.73_m2} (ref >=60)
GFR, Est African American: 80 mL/min/{1.73_m2} (ref >=60)
GLOBULIN: 2.9 g/dL (ref 1.9–3.7)
GLUCOSE: 94 mg/dL (ref 65–99)
Potassium: 4.6 mmol/L (ref 3.5–5.3)
SODIUM: 129 mmol/L — AB (ref 135–146)
Total Bilirubin: 1 mg/dL (ref 0.2–1.2)
Total Protein: 7.1 g/dL (ref 6.1–8.1)

## 2017-10-04 LAB — CBC WITH DIFFERENTIAL/PLATELET
Basophils Absolute: 21 cells/uL (ref 0–200)
Basophils Relative: 0.3 %
Eosinophils Absolute: 119 cells/uL (ref 15–500)
Eosinophils Relative: 1.7 %
HEMATOCRIT: 41.5 % (ref 38.5–50.0)
Hemoglobin: 14.4 g/dL (ref 13.2–17.1)
LYMPHS ABS: 1309 {cells}/uL (ref 850–3900)
MCH: 30.8 pg (ref 27.0–33.0)
MCHC: 34.7 g/dL (ref 32.0–36.0)
MCV: 88.7 fL (ref 80.0–100.0)
MONOS PCT: 10.7 %
MPV: 12 fL (ref 7.5–12.5)
Neutro Abs: 4802 cells/uL (ref 1500–7800)
Neutrophils Relative %: 68.6 %
PLATELETS: 244 10*3/uL (ref 140–400)
RBC: 4.68 10*6/uL (ref 4.20–5.80)
RDW: 12.3 % (ref 11.0–15.0)
TOTAL LYMPHOCYTE: 18.7 %
WBC mixed population: 749 cells/uL (ref 200–950)
WBC: 7 10*3/uL (ref 3.8–10.8)

## 2017-10-04 LAB — LIPID PANEL
CHOLESTEROL: 226 mg/dL — AB (ref <200)
HDL: 53 mg/dL (ref >40)
LDL Cholesterol (Calc): 155 mg/dL (calc) — ABNORMAL HIGH
Non-HDL Cholesterol (Calc): 173 mg/dL (calc) — ABNORMAL HIGH (ref <130)
TRIGLYCERIDES: 74 mg/dL (ref <150)
Total CHOL/HDL Ratio: 4.3 (calc) (ref <5.0)

## 2017-10-04 LAB — VITAMIN B12: Vitamin B-12: 334 pg/mL (ref 200–1100)

## 2017-10-04 LAB — TSH: TSH: 2.43 mIU/L (ref 0.40–4.50)

## 2017-10-04 MED ORDER — HYDROCHLOROTHIAZIDE 25 MG PO TABS: 25.0000 mg | ORAL_TABLET | Freq: Every day | ORAL | 3 refills | Status: DC

## 2017-10-04 MED ORDER — AMLODIPINE BESYLATE 10 MG PO TABS: 10.0000 mg | ORAL_TABLET | Freq: Every day | ORAL | 3 refills | Status: DC

## 2017-10-04 MED ORDER — LOSARTAN POTASSIUM-HCTZ 100-25 MG PO TABS: 1.0000 | ORAL_TABLET | Freq: Every day | ORAL | 3 refills | Status: DC

## 2017-10-04 NOTE — Progress Notes (Signed)
Subjective:    Patient ID: Christopher Bowen, male    DOB: 21-Jul-1935, 83 y.o.   MRN: [id number]  HPI  02/15/17 Patient has not been seen in more than a year. Past medical history is significant for hypertension, acute anemia secondary to GI bleed, thrombocytopenia, and history of smoking cessation. He is currently on lisinopril/hydrochlorothiazide for hypertension. His blood pressure today is well controlled. He denies any chest pain shortness of breath or dyspnea on exertion. He is overdue for lab work including a CBC to monitor his anemia as well as a CMP to monitor his renal function and potassium on his antihypertensive medication. He states that he is also had moderate to severe pain in his right ear for more than a week. He denies any trauma. He does report decreased hearing in the right ear worse than chronic but he does have chronic hearing loss as well. He denies any fever. He denies any recent upper respiratory tract infection. On examination today, the right tympanic membrane is quite remarkable. The tympanic membrane is erythematous. There is blood in the auditory canal emanating from the surface of the tympanic membrane. There is an effusion. The effusion has obscured the landmarks behind the TM. At that time, my plan was: Blood pressure today is well controlled. Check CMP to monitor renal function along with potassium. I will make no change in his dose of Zestoretic. Immunizations are up-to-date. Due to his age, he is no longer recommended to have a colonoscopy or prostate cancer screening. Check a CBC to monitor his anemia and thrombocytopenia. I am quite surprised by his otitis media. Treat the patient with Levaquin 500 mg by mouth daily for 7 days and recheck next week to ensure resolution or sooner if worse  10/04/17 Patient is here today with multiple medical concerns. #1 he reports chronic fatigue. He has a past medical history of a GI bleed but he denies any melena or hematochezia. Fatigue  has been ongoing for several months but has recently got worse. He denies any chest pain shortness of breath or dyspnea on exertion. He denies any melena or hematochezia. He denies any abdominal pain nausea vomiting diarrhea or constipation. He denies any fevers or chills or weight loss. He continues to have a chronic cough which I have attributed to lisinopril. He denies any hemoptysis. He denies any pleurisy. He denies any shortness of breath. He does complain of pain in the left side of his neck down into left shoulder blade. There are no exacerbating or alleviating factors. It occurs on a daily basis and has been there for several months. He attributes it to arthritis or muscle pain.  Past Medical History:  Diagnosis Date  . Anemia   . Diverticulosis   . Former smoker    Past Surgical History:  Procedure Laterality Date  . COLONOSCOPY Left 03/28/2014   Procedure: COLONOSCOPY;  Surgeon: Arta Silence, MD;  Location: [facility];  Service: Endoscopy;  Laterality: Left;   Current Outpatient Medications on File Prior to Visit  Medication Sig Dispense Refill  . lisinopril-hydrochlorothiazide (ZESTORETIC) 20-12.5 MG tablet Take 2 tablets by mouth daily. 180 tablet 3   No current facility-administered medications on file prior to visit.    Allergies  Allergen Reactions  . Penicillins Anaphylaxis   Social History   Socioeconomic History  . Marital status: Unknown    Spouse name: Not on file  . Number of children: Not on file  . Years of education: Not on file  .  Highest education level: Not on file  Social Needs  . Financial resource strain: Not on file  . Food insecurity - worry: Not on file  . Food insecurity - inability: Not on file  . Transportation needs - medical: Not on file  . Transportation needs - non-medical: Not on file  Occupational History  . Not on file  Tobacco Use  . Smoking status: Former Smoker    Packs/day: 0.50    Types: Cigarettes    Last attempt to quit:  09/17/1968    Years since quitting: 49.0  . Smokeless tobacco: Never Used  Substance and Sexual Activity  . Alcohol use: No  . Drug use: No  . Sexual activity: Not on file  Other Topics Concern  . Not on file  Social History Narrative   Lives alone, and does not use assist device, no home health services. 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        Review of Systems  Musculoskeletal: Positive for back pain.  All other systems reviewed and are negative.      Objective:   Physical Exam  Constitutional: He appears well-developed and well-nourished.  HENT:  Left Ear: Tympanic membrane and ear canal normal.  Neck: Neck supple. No JVD present. No thyromegaly present.  Cardiovascular: Normal rate, regular rhythm and normal heart sounds.  No murmur heard. Pulmonary/Chest: Effort normal and breath sounds normal. No respiratory distress. He has no wheezes. He has no rales.  Abdominal: Soft. Bowel sounds are normal. He exhibits no distension. There is no tenderness. There is no rebound and no guarding.  Musculoskeletal: He exhibits no edema.  Lymphadenopathy:    He has no cervical adenopathy.  Vitals reviewed.         Assessment & Plan:  Benign essential HTN - Plan: losartan-hydrochlorothiazide (HYZAAR) 100-25 MG tablet, Lipid panel  Chronic cough - Plan: DG Chest 2 View  Chronic fatigue - Plan: CBC with Differential/Platelet, COMPLETE METABOLIC PANEL WITH GFR, TSH, Vitamin B12  Neck pain - Plan: DG Cervical Spine Complete Discontinue lisinopril hydrochlorothiazide due to cough. Replace with Hyzaar 100/25 by mouth daily for hypertension. Obtain chest x-ray given chronic fatigue and persistent cough. Also obtain x-ray of the cervical spine given the neck pain that he is experiencing. I suspect that the neck pain is musculoskeletal in nature and likely arthritic however I want to rule out underlying bone pathology. Due to the chronic fatigue, I'll check a CBC, CMP, TSH, and vitamin  B12. If chest x-ray, neck x-ray, and lab work is normal, the fatigue may be due to age however I believe this is a basic diagnostic workup that is necessary for thoroughness sake. His review of systems however is very reassuring. We'll checking lab work, I will also screen his cholesterol

## 2017-10-04 NOTE — Addendum Note (Signed)
Addended by: Shary Decamp B on: 10/04/2017 10:22 AM   Modules accepted: Orders

## 2017-10-04 NOTE — Addendum Note (Signed)
Addended by: Shary Decamp B on: 10/04/2017 04:19 PM   Modules accepted: Orders

## 2017-10-09 ENCOUNTER — Other Ambulatory Visit: Payer: Self-pay | Admitting: Family Medicine

## 2017-10-09 DIAGNOSIS — E871 Hypo-osmolality and hyponatremia: Secondary | ICD-10-CM

## 2017-10-21 ENCOUNTER — Ambulatory Visit
Admission: RE | Admit: 2017-10-21 | Discharge: 2017-10-21 | Disposition: A | Discharge status: Home / Self Care | Payer: Medicare Other | Source: Ambulatory Visit | Attending: Family Medicine | Admitting: Family Medicine

## 2017-10-21 DIAGNOSIS — M542 Cervicalgia: Secondary | ICD-10-CM | POA: Diagnosis not present

## 2017-10-21 DIAGNOSIS — R05 Cough: Secondary | ICD-10-CM

## 2017-10-21 DIAGNOSIS — R053 Chronic cough: Secondary | ICD-10-CM

## 2017-10-23 ENCOUNTER — Other Ambulatory Visit: Payer: Self-pay | Admitting: Family Medicine

## 2017-10-23 MED ORDER — DICLOFENAC SODIUM 75 MG PO TBEC: 75.0000 mg | DELAYED_RELEASE_TABLET | Freq: Two times a day (BID) | ORAL | 3 refills | Status: DC | PRN

## 2017-10-24 ENCOUNTER — Other Ambulatory Visit: Payer: Medicare Other

## 2017-10-25 ENCOUNTER — Other Ambulatory Visit: Payer: Medicare Other

## 2017-11-22 ENCOUNTER — Other Ambulatory Visit: Payer: Medicare Other

## 2017-11-22 DIAGNOSIS — E871 Hypo-osmolality and hyponatremia: Secondary | ICD-10-CM

## 2017-11-22 LAB — EXTRA LAV TOP TUBE

## 2017-11-22 LAB — BASIC METABOLIC PANEL
BUN: 19 mg/dL (ref 7–25)
CHLORIDE: 101 mmol/L (ref 98–110)
CO2: 30 mmol/L (ref 20–32)
CREATININE: 0.98 mg/dL (ref 0.70–1.11)
Calcium: 9.4 mg/dL (ref 8.6–10.3)
Glucose, Bld: 100 mg/dL — ABNORMAL HIGH (ref 65–99)
POTASSIUM: 3.8 mmol/L (ref 3.5–5.3)
Sodium: 140 mmol/L (ref 135–146)

## 2018-01-13 ENCOUNTER — Encounter: Payer: Self-pay | Admitting: Family Medicine

## 2018-04-07 ENCOUNTER — Encounter: Payer: Self-pay | Admitting: Family Medicine

## 2018-05-06 ENCOUNTER — Ambulatory Visit: Payer: Medicare Other | Admitting: Family Medicine

## 2018-05-08 ENCOUNTER — Ambulatory Visit: Payer: Medicare Other | Admitting: Family Medicine

## 2018-05-09 ENCOUNTER — Ambulatory Visit (INDEPENDENT_AMBULATORY_CARE_PROVIDER_SITE_OTHER): Payer: Medicare Other | Admitting: Family Medicine

## 2018-05-09 ENCOUNTER — Encounter: Payer: Self-pay | Admitting: Family Medicine

## 2018-05-09 DIAGNOSIS — L819 Disorder of pigmentation, unspecified: Secondary | ICD-10-CM | POA: Insufficient documentation

## 2018-05-09 DIAGNOSIS — I781 Nevus, non-neoplastic: Secondary | ICD-10-CM

## 2018-05-09 DIAGNOSIS — L820 Inflamed seborrheic keratosis: Secondary | ICD-10-CM | POA: Diagnosis not present

## 2018-05-09 NOTE — Progress Notes (Signed)
Subjective:    Patient ID: Christopher Bowen, male    DOB: 26-Sep-1934, 83 y.o.   MRN: [id number]  Patient has a large brown warty lesion on his left cheek just over the angle of the mandible anterior and slightly inferior to his ear.  It is 2.1 cm in diameter.  It has irregular margins.  It has changed recently and is now growing a warty appendage from the center that is pink in color.  Given the sudden change in what had previously been a stable benign-appearing seborrheic keratosis, the patient is concerned and is requesting excisional biopsy to rule out malignancy.  I offered the patient an excisional biopsy today.  We discussed the risk of the procedure.  This includes bleeding, infection, and scarring.  Patient agrees and elects to proceed here with a excisional biopsy. Past Medical History:  Diagnosis Date  . Anemia   . Diverticulosis   . Former smoker    Past Surgical History:  Procedure Laterality Date  . COLONOSCOPY Left 03/28/2014   Procedure: COLONOSCOPY;  Surgeon: Arta Silence, MD;  Location: [facility];  Service: Endoscopy;  Laterality: Left;   Current Outpatient Medications on File Prior to Visit  Medication Sig Dispense Refill  . diclofenac (VOLTAREN) 75 MG EC tablet Take 1 tablet (75 mg total) by mouth 2 (two) times daily as needed (Neck pain). 60 tablet 3  . hydrochlorothiazide (HYDRODIURIL) 25 MG tablet Take 1 tablet (25 mg total) by mouth daily. 90 tablet 3  . amLODipine (NORVASC) 10 MG tablet Take 1 tablet (10 mg total) by mouth daily. (Patient not taking: Reported on 05/09/2018) 90 tablet 3   No current facility-administered medications on file prior to visit.    Allergies  Allergen Reactions  . Penicillins Anaphylaxis   Social History   Socioeconomic History  . Marital status: Unknown    Spouse name: Not on file  . Number of children: Not on file  . Years of education: Not on file  . Highest education level: Not on file  Occupational History  . Not on file    Social Needs  . Financial resource strain: Not on file  . Food insecurity:    Worry: Not on file    Inability: Not on file  . Transportation needs:    Medical: Not on file    Non-medical: Not on file  Tobacco Use  . Smoking status: Former Smoker    Packs/day: 0.50    Types: Cigarettes    Last attempt to quit: 09/17/1968    Years since quitting: 49.6  . Smokeless tobacco: Never Used  Substance and Sexual Activity  . Alcohol use: No  . Drug use: No  . Sexual activity: Not on file  Lifestyle  . Physical activity:    Days per week: Not on file    Minutes per session: Not on file  . Stress: Not on file  Relationships  . Social connections:    Talks on phone: Not on file    Gets together: Not on file    Attends religious service: Not on file    Active member of club or organization: Not on file    Attends meetings of clubs or organizations: Not on file    Relationship status: Not on file  . Intimate partner violence:    Fear of current or ex partner: Not on file    Emotionally abused: Not on file    Physically abused: Not on file    Forced sexual activity:  Not on file  Other Topics Concern  . Not on file  Social History Narrative   Lives alone, and does not use assist device, no home health services. 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        Review of Systems  Musculoskeletal: Positive for back pain.  All other systems reviewed and are negative.      Objective:   Physical Exam  Constitutional: He appears well-developed and well-nourished.  HENT:  Head:    Left Ear: Tympanic membrane and ear canal normal.  Cardiovascular: Normal rate, regular rhythm and normal heart sounds.  No murmur heard. Pulmonary/Chest: Effort normal and breath sounds normal. No respiratory distress. He has no wheezes. He has no rales.  Vitals reviewed.         Assessment & Plan:  Change in pigmented skin lesion of face After discussing risk and benefits, the patient elects to  proceed with an excisional biopsy here.  He is willing to accept scarring for peace of mind and ruling out malignancy given the sudden change.  The lesion was anesthetized with 5 cc of 0.1% lidocaine with epinephrine in a circumferential pattern around the lesion at its base.  The patient was prepped and draped in sterile fashion.  An elliptical excision was performed around the lesion down to the underlying fascia and the lesion was removed in its entirety.  This left a 2.5 cm x 3 cm elliptical excision with underlying fascia exposed.  Skin edges were then approximated using 6, simple interrupted 3-0 Ethilon sutures.  There was minimal blood loss.  Lesion was sent to pathology in a labeled container.  Wound was cleaned and bandaged.  Wound care was discussed.  Return in 1 week for suture removal or sooner if complications arise

## 2018-05-13 LAB — TISSUE SPECIMEN

## 2018-05-13 LAB — PATHOLOGY

## 2018-05-16 ENCOUNTER — Encounter: Payer: Self-pay | Admitting: Family Medicine

## 2018-05-16 ENCOUNTER — Ambulatory Visit (INDEPENDENT_AMBULATORY_CARE_PROVIDER_SITE_OTHER): Payer: Medicare Other | Admitting: Family Medicine

## 2018-05-16 VITALS — BP 130/68 | HR 74 | Temp 97.7°F | Resp 14 | Ht 69.0 in | Wt 206.0 lb

## 2018-05-16 DIAGNOSIS — Z4802 Encounter for removal of sutures: Secondary | ICD-10-CM | POA: Diagnosis not present

## 2018-05-16 NOTE — Progress Notes (Signed)
Subjective:    Patient ID: Christopher Bowen, male    DOB: 03-31-1935, 83 y.o.   MRN: [id number] 05/09/18  Patient has a large brown warty lesion on his left cheek just over the angle of the mandible anterior and slightly inferior to his ear.  It is 2.1 cm in diameter.  It has irregular margins.  It has changed recently and is now growing a warty appendage from the center that is pink in color.  Given the sudden change in what had previously been a stable benign-appearing seborrheic keratosis, the patient is concerned and is requesting excisional biopsy to rule out malignancy.  I offered the patient an excisional biopsy today.  We discussed the risk of the procedure.  This includes bleeding, infection, and scarring.  Patient agrees and elects to proceed here with a excisional biopsy.  At that time, my plan was: After discussing risk and benefits, the patient elects to proceed with an excisional biopsy here.  He is willing to accept scarring for peace of mind and ruling out malignancy given the sudden change.  The lesion was anesthetized with 5 cc of 0.1% lidocaine with epinephrine in a circumferential pattern around the lesion at its base.  The patient was prepped and draped in sterile fashion.  An elliptical excision was performed around the lesion down to the underlying fascia and the lesion was removed in its entirety.  This left a 2.5 cm x 3 cm elliptical excision with underlying fascia exposed.  Skin edges were then approximated using 6, simple interrupted 3-0 Ethilon sutures.  There was minimal blood loss.  Lesion was sent to pathology in a labeled container.  Wound was cleaned and bandaged.  Wound care was discussed.  Return in 1 week for suture removal or sooner if complications arise  1/57/26 Biopsy revealed benign SK.  Today for suture removal Past Medical History:  Diagnosis Date  . Anemia   . Diverticulosis   . Former smoker    Past Surgical History:  Procedure Laterality Date  . COLONOSCOPY  Left 03/28/2014   Procedure: COLONOSCOPY;  Surgeon: Arta Silence, MD;  Location: [facility];  Service: Endoscopy;  Laterality: Left;   Current Outpatient Medications on File Prior to Visit  Medication Sig Dispense Refill  . amLODipine (NORVASC) 10 MG tablet Take 1 tablet (10 mg total) by mouth daily. (Patient not taking: Reported on 05/09/2018) 90 tablet 3  . diclofenac (VOLTAREN) 75 MG EC tablet Take 1 tablet (75 mg total) by mouth 2 (two) times daily as needed (Neck pain). 60 tablet 3  . hydrochlorothiazide (HYDRODIURIL) 25 MG tablet Take 1 tablet (25 mg total) by mouth daily. 90 tablet 3   No current facility-administered medications on file prior to visit.    Allergies  Allergen Reactions  . Penicillins Anaphylaxis   Social History   Socioeconomic History  . Marital status: Unknown    Spouse name: Not on file  . Number of children: Not on file  . Years of education: Not on file  . Highest education level: Not on file  Occupational History  . Not on file  Social Needs  . Financial resource strain: Not on file  . Food insecurity:    Worry: Not on file    Inability: Not on file  . Transportation needs:    Medical: Not on file    Non-medical: Not on file  Tobacco Use  . Smoking status: Former Smoker    Packs/day: 0.50    Types: Cigarettes  Last attempt to quit: 09/17/1968    Years since quitting: 49.6  . Smokeless tobacco: Never Used  Substance and Sexual Activity  . Alcohol use: No  . Drug use: No  . Sexual activity: Not on file  Lifestyle  . Physical activity:    Days per week: Not on file    Minutes per session: Not on file  . Stress: Not on file  Relationships  . Social connections:    Talks on phone: Not on file    Gets together: Not on file    Attends religious service: Not on file    Active member of club or organization: Not on file    Attends meetings of clubs or organizations: Not on file    Relationship status: Not on file  . Intimate partner  violence:    Fear of current or ex partner: Not on file    Emotionally abused: Not on file    Physically abused: Not on file    Forced sexual activity: Not on file  Other Topics Concern  . Not on file  Social History Narrative   Lives alone, and does not use assist device, no home health services. 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        Review of Systems  Musculoskeletal: Positive for back pain.  All other systems reviewed and are negative.      Objective:   Physical Exam  Constitutional: He appears well-developed and well-nourished.  HENT:  Left Ear: Tympanic membrane and ear canal normal.  Cardiovascular: Normal rate, regular rhythm and normal heart sounds.  No murmur heard. Pulmonary/Chest: Effort normal and breath sounds normal.  Vitals reviewed.         Assessment & Plan:  Suture removal  Sutures removed without difficulty x6.  Wound care was discussed.  Area was covered with Neosporin.

## 2018-11-28 ENCOUNTER — Other Ambulatory Visit: Payer: Self-pay | Admitting: Family Medicine

## 2019-03-27 ENCOUNTER — Other Ambulatory Visit: Payer: Self-pay | Admitting: Family Medicine

## 2019-05-15 IMAGING — CR DG CERVICAL SPINE COMPLETE 4+V
6 series · 6 of 6 positions shown · non-contrast
Comparison: None.

CLINICAL DATA: 82 y/o M; 3 months of neck pain with more pain at
the base of neck. No trauma.

EXAM:
CERVICAL SPINE - COMPLETE 4+ VIEW

[w cervical spine lat]
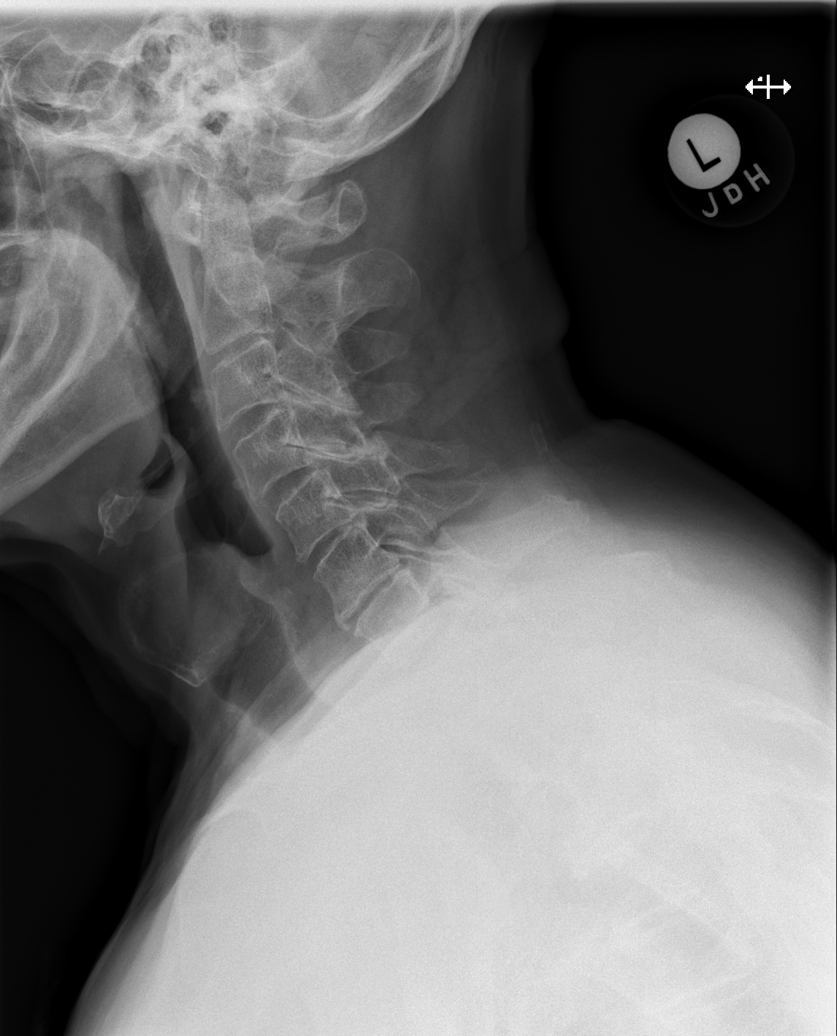

[w cervical spine ap_obl (1 of 2)]
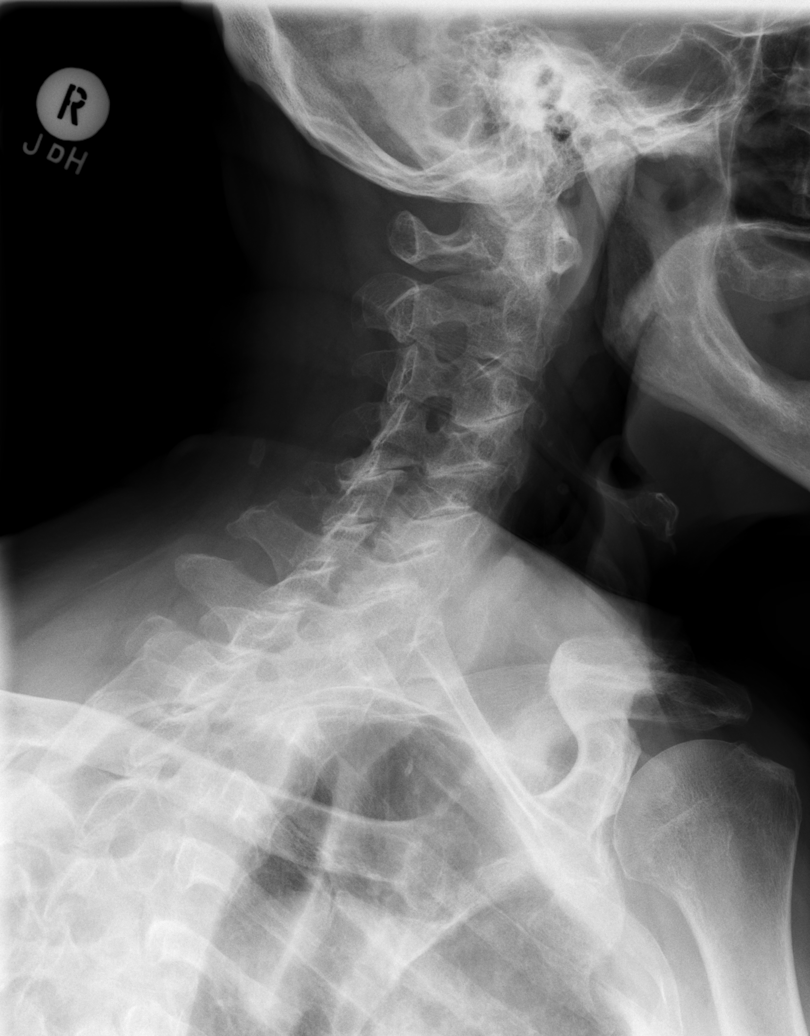

[w cervical spine ap_obl (2 of 2)]
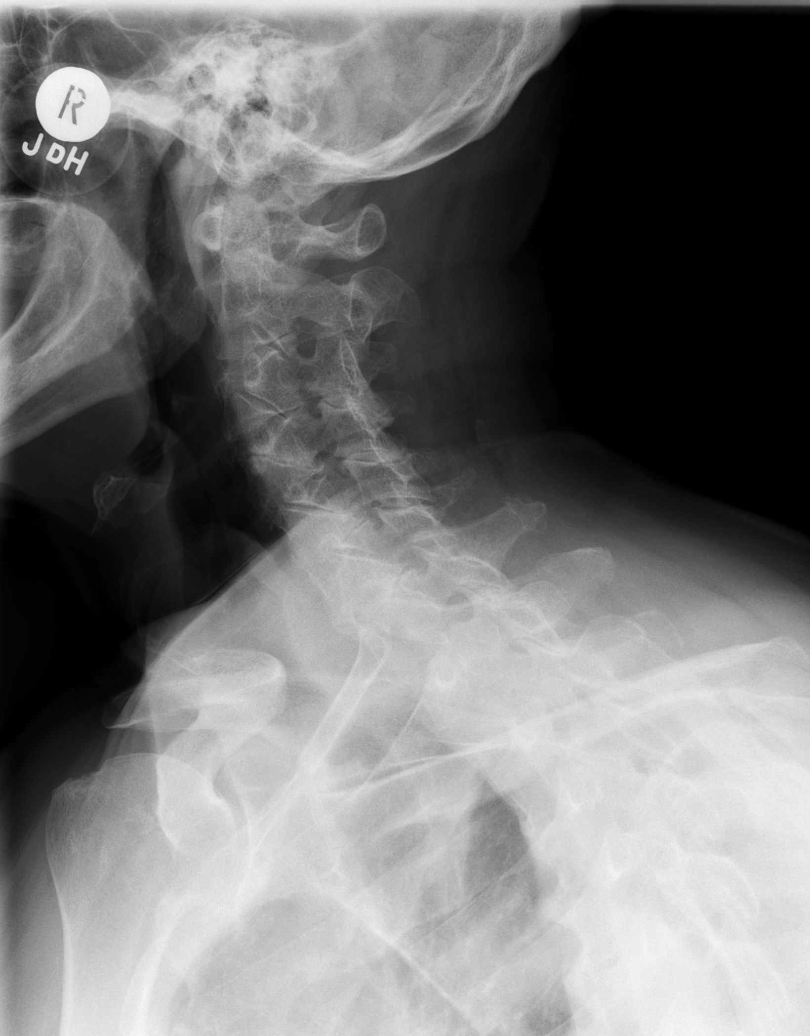

[w cervical spine ap]
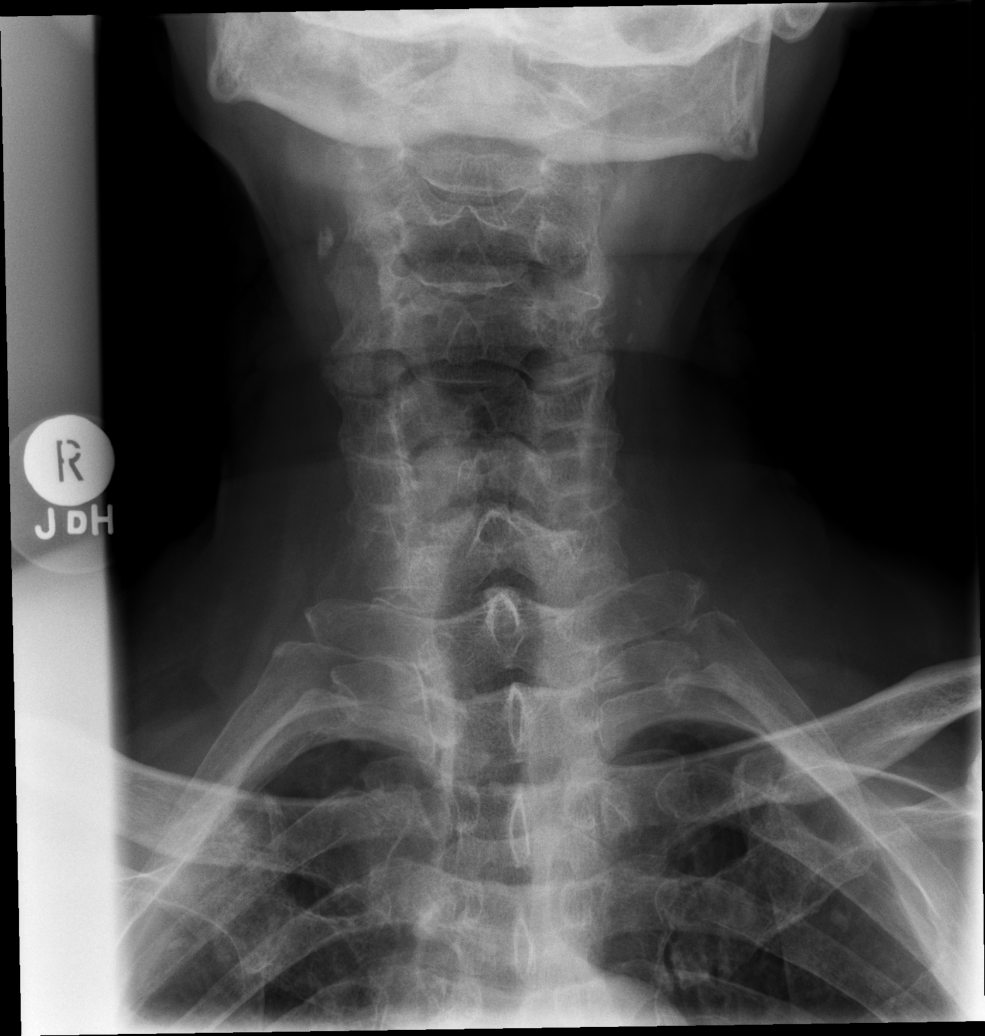

[w cervical spine odontoid]
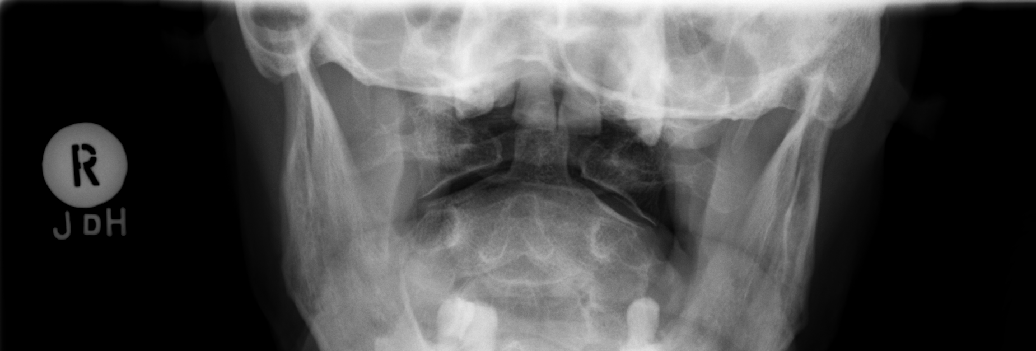

[w cervical swimmers]
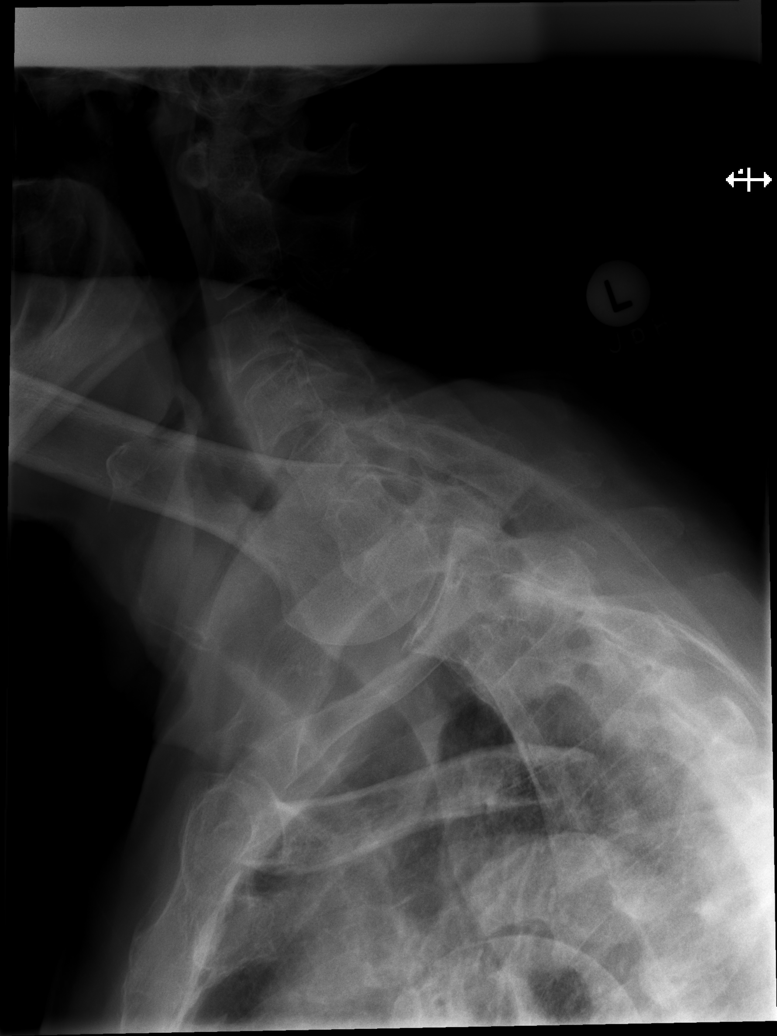

[6 of 6 positions shown; findings below may reference images not displayed]

FINDINGS: Mild S-shaped curvature of the cervicothoracic junction. Normal
cervical lordosis without listhesis. No loss of vertebral body
height or acute fracture. No prevertebral soft tissue thickening.
C5-C7 moderate loss of intervertebral disc space height.
Left-greater-than-right facet arthrosis. Uncovertebral and facet
hypertrophy encroach on the right neural foramen at the C4-C6 levels
and left neural foramen at the C3-6 levels.
IMPRESSION: No acute osseous abnormality. Moderate cervical spondylosis greatest
at the C5-C7 levels prominent left-sided facet arthropathy.

By: Schares Jans M.D.

## 2019-06-26 ENCOUNTER — Other Ambulatory Visit: Payer: Self-pay | Admitting: Family Medicine

## 2019-08-18 ENCOUNTER — Other Ambulatory Visit: Payer: Self-pay

## 2019-08-18 ENCOUNTER — Ambulatory Visit (INDEPENDENT_AMBULATORY_CARE_PROVIDER_SITE_OTHER): Payer: Medicare Other | Admitting: Family Medicine

## 2019-08-18 ENCOUNTER — Other Ambulatory Visit: Payer: Self-pay | Admitting: Family Medicine

## 2019-08-18 VITALS — BP 130/64 | HR 68 | Temp 97.3°F | Resp 14 | Ht 69.0 in | Wt 211.0 lb

## 2019-08-18 DIAGNOSIS — I10 Essential (primary) hypertension: Secondary | ICD-10-CM | POA: Diagnosis not present

## 2019-08-18 DIAGNOSIS — Z23 Encounter for immunization: Secondary | ICD-10-CM | POA: Diagnosis not present

## 2019-08-18 NOTE — Progress Notes (Signed)
Subjective:    Patient ID: Christopher Bowen, male    DOB: 09-16-1935, 84 y.o.   MRN: [id number]  Back Pain  Medication Refill    02/15/17 Patient has not been seen in more than a year. Past medical history is significant for hypertension, acute anemia secondary to GI bleed, thrombocytopenia, and history of smoking cessation. He is currently on lisinopril/hydrochlorothiazide for hypertension. His blood pressure today is well controlled. He denies any chest pain shortness of breath or dyspnea on exertion. He is overdue for lab work including a CBC to monitor his anemia as well as a CMP to monitor his renal function and potassium on his antihypertensive medication. He states that he is also had moderate to severe pain in his right ear for more than a week. He denies any trauma. He does report decreased hearing in the right ear worse than chronic but he does have chronic hearing loss as well. He denies any fever. He denies any recent upper respiratory tract infection. On examination today, the right tympanic membrane is quite remarkable. The tympanic membrane is erythematous. There is blood in the auditory canal emanating from the surface of the tympanic membrane. There is an effusion. The effusion has obscured the landmarks behind the TM. At that time, my plan was: Blood pressure today is well controlled. Check CMP to monitor renal function along with potassium. I will make no change in his dose of Zestoretic. Immunizations are up-to-date. Due to his age, he is no longer recommended to have a colonoscopy or prostate cancer screening. Check a CBC to monitor his anemia and thrombocytopenia. I am quite surprised by his otitis media. Treat the patient with Levaquin 500 mg by mouth daily for 7 days and recheck next week to ensure resolution or sooner if worse  10/04/17 Patient is here today with multiple medical concerns. #1 he reports chronic fatigue. He has a past medical history of a GI bleed but he denies any  melena or hematochezia. Fatigue has been ongoing for several months but has recently got worse. He denies any chest pain shortness of breath or dyspnea on exertion. He denies any melena or hematochezia. He denies any abdominal pain nausea vomiting diarrhea or constipation. He denies any fevers or chills or weight loss. He continues to have a chronic cough which I have attributed to lisinopril. He denies any hemoptysis. He denies any pleurisy. He denies any shortness of breath. He does complain of pain in the left side of his neck down into left shoulder blade. There are no exacerbating or alleviating factors. It occurs on a daily basis and has been there for several months. He attributes it to arthritis or muscle pain.  At that time, my plan was: Discontinue lisinopril hydrochlorothiazide due to cough. Replace with Hyzaar 100/25 by mouth daily for hypertension. Obtain chest x-ray given chronic fatigue and persistent cough. Also obtain x-ray of the cervical spine given the neck pain that he is experiencing. I suspect that the neck pain is musculoskeletal in nature and likely arthritic however I want to rule out underlying bone pathology. Due to the chronic fatigue, I'll check a CBC, CMP, TSH, and vitamin B12. If chest x-ray, neck x-ray, and lab work is normal, the fatigue may be due to age however I believe this is a basic diagnostic workup that is necessary for thoroughness sake. His review of systems however is very reassuring. We'll checking lab work, I will also screen his cholesterol   08/18/19 Patient is a very  pleasant 84 year old Caucasian male here today for a checkup for hypertension.  He denies any concerns.  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  He denies any chest pain with activity.  He denies any shortness of breath or dyspnea on exertion.  On his exam today he does have a faint 1/6 systolic ejection murmur heard best over the aortic valve however this  is completely asymptomatic.  He denies any palpitations or syncope or near syncope.  He has no carotid bruit.  He checks his blood pressure at home and states that is typically in the 130s over 60-80.  He is due for his flu shot.  Due to his age I would not recommend prostate cancer screening.  Colonoscopy is up-to-date.  The patient denies any issues with memory loss.  He denies any depression.  He denies any falls.  Past Medical History:  Diagnosis Date  . Anemia   . Diverticulosis   . Former smoker    Past Surgical History:  Procedure Laterality Date  . COLONOSCOPY Left 03/28/2014   Procedure: COLONOSCOPY;  Surgeon: Arta Silence, MD;  Location: [facility];  Service: Endoscopy;  Laterality: Left;   Current Outpatient Medications on File Prior to Visit  Medication Sig Dispense Refill  . amLODipine (NORVASC) 10 MG tablet Take 1 tablet by mouth once daily 30 tablet 0  . hydrochlorothiazide (HYDRODIURIL) 25 MG tablet Take 1 tablet by mouth once daily 30 tablet 0   No current facility-administered medications on file prior to visit.    Allergies  Allergen Reactions  . Penicillins Anaphylaxis   Social History   Socioeconomic History  . Marital status: Unknown    Spouse name: Not on file  . Number of children: Not on file  . Years of education: Not on file  . Highest education level: Not on file  Occupational History  . Not on file  Social Needs  . Financial resource strain: Not on file  . Food insecurity    Worry: Not on file    Inability: Not on file  . Transportation needs    Medical: Not on file    Non-medical: Not on file  Tobacco Use  . Smoking status: Former Smoker    Packs/day: 0.50    Types: Cigarettes    Quit date: 09/17/1968    Years since quitting: 50.9  . Smokeless tobacco: Never Used  Substance and Sexual Activity  . Alcohol use: No  . Drug use: No  . Sexual activity: Not on file  Lifestyle  . Physical activity    Days per week: Not on file    Minutes  per session: Not on file  . Stress: Not on file  Relationships  . Social Herbalist on phone: Not on file    Gets together: Not on file    Attends religious service: Not on file    Active member of club or organization: Not on file    Attends meetings of clubs or organizations: Not on file    Relationship status: Not on file  . Intimate partner violence    Fear of current or ex partner: Not on file    Emotionally abused: Not on file    Physically abused: Not on file    Forced sexual activity: Not on file  Other Topics Concern  . Not on file  Social History Narrative   Lives alone, and does not use assist device, no home health services.  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        Review of Systems  Musculoskeletal: Positive for back pain.  All other systems reviewed and are negative.      Objective:   Physical Exam  Constitutional: He appears well-developed and well-nourished.  HENT:  Left Ear: Tympanic membrane and ear canal normal.  Neck: Neck supple. No JVD present. No thyromegaly present.  Cardiovascular: Normal rate and regular rhythm.  Murmur heard. Pulmonary/Chest: Effort normal and breath sounds normal. No respiratory distress. He has no wheezes. He has no rales.  Abdominal: Soft. Bowel sounds are normal. He exhibits no distension. There is no abdominal tenderness. There is no rebound and no guarding.  Musculoskeletal:        General: No edema.  Lymphadenopathy:    He has no cervical adenopathy.  Vitals reviewed.         Assessment & Plan:  Benign essential HTN - Plan: CBC with Differential, COMPLETE METABOLIC PANEL WITH GFR, LDL Cholesterol, Direct Patient received his flu shot today.  Aside from a soft systolic ejection murmur, his physical exam is completely normal.  Blood pressure is well controlled.  Therefore I will check a BMP and assuming no normalities in his potassium or renal function I will make no changes in his medication at this time.   I will also check a direct LDL to rule out hyperlipidemia as the patient does not come back for fasting lab work.  Patient denies any falls, depression, or memory loss.  He does occasionally get lower back pain however he manages this by avoiding lifting heavy objects.

## 2019-08-19 LAB — CBC WITH DIFFERENTIAL/PLATELET
Absolute Monocytes: 540 cells/uL (ref 200–950)
Basophils Absolute: 30 cells/uL (ref 0–200)
Basophils Relative: 0.4 %
Eosinophils Absolute: 118 cells/uL (ref 15–500)
Eosinophils Relative: 1.6 %
HCT: 40.7 % (ref 38.5–50.0)
Hemoglobin: 13.9 g/dL (ref 13.2–17.1)
Lymphs Abs: 1931 cells/uL (ref 850–3900)
MCH: 31.5 pg (ref 27.0–33.0)
MCHC: 34.2 g/dL (ref 32.0–36.0)
MCV: 92.3 fL (ref 80.0–100.0)
MPV: 12.7 fL — ABNORMAL HIGH (ref 7.5–12.5)
Monocytes Relative: 7.3 %
Neutro Abs: 4780 cells/uL (ref 1500–7800)
Neutrophils Relative %: 64.6 %
Platelets: 226 10*3/uL (ref 140–400)
RBC: 4.41 10*6/uL (ref 4.20–5.80)
RDW: 12.2 % (ref 11.0–15.0)
Total Lymphocyte: 26.1 %
WBC: 7.4 10*3/uL (ref 3.8–10.8)

## 2019-08-19 LAB — COMPLETE METABOLIC PANEL WITH GFR
AG Ratio: 1.3 (calc) (ref 1.0–2.5)
ALT: 13 U/L (ref 9–46)
AST: 17 U/L (ref 10–35)
Albumin: 4 g/dL (ref 3.6–5.1)
Alkaline phosphatase (APISO): 64 U/L (ref 35–144)
BUN: 15 mg/dL (ref 7–25)
CO2: 28 mmol/L (ref 20–32)
Calcium: 9.1 mg/dL (ref 8.6–10.3)
Chloride: 102 mmol/L (ref 98–110)
Creat: 0.97 mg/dL (ref 0.70–1.11)
GFR, Est African American: 84 mL/min/{1.73_m2} (ref >=60)
GFR, Est Non African American: 72 mL/min/{1.73_m2} (ref >=60)
Globulin: 3.1 g/dL (calc) (ref 1.9–3.7)
Glucose, Bld: 120 mg/dL — ABNORMAL HIGH (ref 65–99)
Potassium: 3.8 mmol/L (ref 3.5–5.3)
Sodium: 140 mmol/L (ref 135–146)
Total Bilirubin: 1 mg/dL (ref 0.2–1.2)
Total Protein: 7.1 g/dL (ref 6.1–8.1)

## 2019-08-19 LAB — LDL CHOLESTEROL, DIRECT: Direct LDL: 138 mg/dL — ABNORMAL HIGH (ref <100)

## 2019-08-19 NOTE — Addendum Note (Signed)
Addended by: Shary Decamp B on: 08/19/2019 08:33 AM   Modules accepted: Orders

## 2019-08-20 ENCOUNTER — Encounter: Payer: Self-pay | Admitting: Family Medicine

## 2019-12-02 ENCOUNTER — Other Ambulatory Visit: Payer: Self-pay | Admitting: Family Medicine

## 2020-02-01 ENCOUNTER — Ambulatory Visit: Payer: Medicare Other | Admitting: Family Medicine

## 2020-08-22 ENCOUNTER — Other Ambulatory Visit: Payer: Self-pay

## 2020-08-22 ENCOUNTER — Other Ambulatory Visit: Payer: Medicare Other

## 2020-08-22 DIAGNOSIS — I10 Essential (primary) hypertension: Secondary | ICD-10-CM | POA: Diagnosis not present

## 2020-08-22 DIAGNOSIS — Z13220 Encounter for screening for lipoid disorders: Secondary | ICD-10-CM

## 2020-08-22 DIAGNOSIS — Z136 Encounter for screening for cardiovascular disorders: Secondary | ICD-10-CM | POA: Diagnosis not present

## 2020-08-23 LAB — COMPLETE METABOLIC PANEL WITH GFR
AG Ratio: 1.3 (calc) (ref 1.0–2.5)
ALT: 12 U/L (ref 9–46)
AST: 16 U/L (ref 10–35)
Albumin: 4.1 g/dL (ref 3.6–5.1)
Alkaline phosphatase (APISO): 67 U/L (ref 35–144)
BUN: 13 mg/dL (ref 7–25)
CO2: 31 mmol/L (ref 20–32)
Calcium: 9.2 mg/dL (ref 8.6–10.3)
Chloride: 100 mmol/L (ref 98–110)
Creat: 0.85 mg/dL (ref 0.70–1.11)
GFR, Est African American: 93 mL/min/{1.73_m2} (ref >=60)
GFR, Est Non African American: 81 mL/min/{1.73_m2} (ref >=60)
Globulin: 3.2 g/dL (calc) (ref 1.9–3.7)
Glucose, Bld: 93 mg/dL (ref 65–99)
Potassium: 4.1 mmol/L (ref 3.5–5.3)
Sodium: 138 mmol/L (ref 135–146)
Total Bilirubin: 1.5 mg/dL — ABNORMAL HIGH (ref 0.2–1.2)
Total Protein: 7.3 g/dL (ref 6.1–8.1)

## 2020-08-23 LAB — CBC WITH DIFFERENTIAL/PLATELET
Absolute Monocytes: 650 cells/uL (ref 200–950)
Basophils Absolute: 22 cells/uL (ref 0–200)
Basophils Relative: 0.3 %
Eosinophils Absolute: 153 cells/uL (ref 15–500)
Eosinophils Relative: 2.1 %
HCT: 42.9 % (ref 38.5–50.0)
Hemoglobin: 14.4 g/dL (ref 13.2–17.1)
Lymphs Abs: 1628 cells/uL (ref 850–3900)
MCH: 31 pg (ref 27.0–33.0)
MCHC: 33.6 g/dL (ref 32.0–36.0)
MCV: 92.5 fL (ref 80.0–100.0)
MPV: 12.3 fL (ref 7.5–12.5)
Monocytes Relative: 8.9 %
Neutro Abs: 4847 cells/uL (ref 1500–7800)
Neutrophils Relative %: 66.4 %
Platelets: 251 10*3/uL (ref 140–400)
RBC: 4.64 10*6/uL (ref 4.20–5.80)
RDW: 12 % (ref 11.0–15.0)
Total Lymphocyte: 22.3 %
WBC: 7.3 10*3/uL (ref 3.8–10.8)

## 2020-08-23 LAB — LIPID PANEL
Cholesterol: 188 mg/dL (ref <200)
HDL: 47 mg/dL (ref >=40)
LDL Cholesterol (Calc): 123 mg/dL (calc) — ABNORMAL HIGH
Non-HDL Cholesterol (Calc): 141 mg/dL (calc) — ABNORMAL HIGH (ref <130)
Total CHOL/HDL Ratio: 4 (calc) (ref <5.0)
Triglycerides: 85 mg/dL (ref <150)

## 2020-08-25 ENCOUNTER — Ambulatory Visit (INDEPENDENT_AMBULATORY_CARE_PROVIDER_SITE_OTHER): Payer: Medicare Other | Admitting: Family Medicine

## 2020-08-25 ENCOUNTER — Other Ambulatory Visit: Payer: Self-pay

## 2020-08-25 VITALS — BP 140/70 | HR 67 | Temp 97.5°F | Ht 66.0 in | Wt 205.0 lb

## 2020-08-25 DIAGNOSIS — E7800 Pure hypercholesterolemia, unspecified: Secondary | ICD-10-CM

## 2020-08-25 DIAGNOSIS — I10 Essential (primary) hypertension: Secondary | ICD-10-CM | POA: Diagnosis not present

## 2020-08-25 DIAGNOSIS — R011 Cardiac murmur, unspecified: Secondary | ICD-10-CM | POA: Diagnosis not present

## 2020-08-25 DIAGNOSIS — Z23 Encounter for immunization: Secondary | ICD-10-CM | POA: Diagnosis not present

## 2020-08-25 DIAGNOSIS — Z0001 Encounter for general adult medical examination with abnormal findings: Secondary | ICD-10-CM | POA: Diagnosis not present

## 2020-08-25 DIAGNOSIS — Z87891 Personal history of nicotine dependence: Secondary | ICD-10-CM | POA: Diagnosis not present

## 2020-08-25 DIAGNOSIS — Z0000 Encounter for general adult medical examination without abnormal findings: Secondary | ICD-10-CM

## 2020-08-25 MED ORDER — ATORVASTATIN CALCIUM 20 MG PO TABS: 20.0000 mg | ORAL_TABLET | Freq: Every day | ORAL | 3 refills | Status: DC

## 2020-08-25 NOTE — Progress Notes (Signed)
Subjective:    Patient ID: Christopher Bowen, male    DOB: January 04, 1935, 85 y.o.   MRN: [id number]  HPI Patient is an 85 year old gentleman here today for complete physical exam.  Due to his age, I would not recommend prostate cancer screening or colon cancer screening unless the patient had problems.  He denies any blood in his stool or melena or abdominal pain or dysuria or frequency or lower urinary tract symptoms.  He is due for a flu shot.  Pneumovax and Prevnar up-to-date.  Has had 2 doses of the Covid vaccine.  He is due for the booster.  His blood pressure today is adequately controlled for age at 140/70.  He does have some dull aching pains in both shoulders that improves with Aleve.  This is been a chronic problem and does not bother him terribly.  He denies any memory loss, falls, or dementia.  He denies any depression.  However his cardiac murmur has worsened and is now 3/6 Past Medical History:  Diagnosis Date  . Anemia   . Diverticulosis   . Former smoker    Past Surgical History:  Procedure Laterality Date  . COLONOSCOPY Left 03/28/2014   Procedure: COLONOSCOPY;  Surgeon: Arta Silence, MD;  Location: [facility];  Service: Endoscopy;  Laterality: Left;   Current Outpatient Medications on File Prior to Visit  Medication Sig Dispense Refill  . amLODipine (NORVASC) 10 MG tablet Take 1 tablet by mouth once daily 90 tablet 3  . hydrochlorothiazide (HYDRODIURIL) 25 MG tablet Take 1 tablet by mouth once daily 90 tablet 3   No current facility-administered medications on file prior to visit.   Allergies  Allergen Reactions  . Penicillins Anaphylaxis   Social History   Socioeconomic History  . Marital status: Unknown    Spouse name: Not on file  . Number of children: Not on file  . Years of education: Not on file  . Highest education level: Not on file  Occupational History  . Not on file  Tobacco Use  . Smoking status: Former Smoker    Packs/day: 0.50    Types: Cigarettes     Quit date: 09/17/1968    Years since quitting: 51.9  . Smokeless tobacco: Never Used  Substance and Sexual Activity  . Alcohol use: No  . Drug use: No  . Sexual activity: Not on file  Other Topics Concern  . Not on file  Social History Narrative   Lives alone, and does not use assist device, no home health services. 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     Social Determinants of Health   Financial Resource Strain: Not on file  Food Insecurity: Not on file  Transportation Needs: Not on file  Physical Activity: Not on file  Stress: Not on file  Social Connections: Not on file  Intimate Partner Violence: Not on file   Family History  Problem Relation Age of Onset  . Colon cancer Neg Hx   . Ulcerative colitis Neg Hx   . Crohn's disease Neg Hx   . Diabetes Mother   . Cerebral aneurysm Mother   . Heart disease Father        age 66  . Diabetes Brother       Review of Systems  All other systems reviewed and are negative.      Objective:   Physical Exam Vitals reviewed.  Constitutional:      General: He is not in acute distress.    Appearance:  Normal appearance. He is well-developed. He is obese. He is not diaphoretic.  HENT:     Head: Normocephalic and atraumatic.     Right Ear: Tympanic membrane, ear canal and external ear normal.     Left Ear: Tympanic membrane, ear canal and external ear normal.     Nose: Nose normal. No congestion or rhinorrhea.     Mouth/Throat:     Pharynx: No oropharyngeal exudate.  Eyes:     General: No scleral icterus.       Right eye: No discharge.        Left eye: No discharge.     Conjunctiva/sclera: Conjunctivae normal.     Pupils: Pupils are equal, round, and reactive to light.  Neck:     Thyroid: No thyromegaly.     Vascular: No JVD.     Trachea: No tracheal deviation.  Cardiovascular:     Rate and Rhythm: Normal rate and regular rhythm.     Heart sounds: Murmur heard.   Systolic murmur is present with a grade of 3/6. No  friction rub. No gallop.   Pulmonary:     Effort: Pulmonary effort is normal. No respiratory distress.     Breath sounds: Normal breath sounds. No stridor. No wheezing, rhonchi or rales.  Chest:     Chest wall: No tenderness.  Abdominal:     General: Bowel sounds are normal. There is no distension.     Palpations: Abdomen is soft. There is no mass.     Tenderness: There is no abdominal tenderness. There is no guarding or rebound.  Musculoskeletal:        General: No tenderness.     Cervical back: Normal range of motion and neck supple.     Thoracic back: No spasms or tenderness. Normal range of motion.     Lumbar back: No spasms, tenderness or bony tenderness. Decreased range of motion.     Right lower leg: No edema.  Lymphadenopathy:     Cervical: No cervical adenopathy.  Skin:    General: Skin is warm.     Coloration: Skin is not jaundiced or pale.     Findings: No bruising, erythema, lesion or rash.  Neurological:     Mental Status: He is alert and oriented to person, place, and time.     Cranial Nerves: No cranial nerve deficit.     Sensory: No sensory deficit.     Motor: No weakness or abnormal muscle tone.     Coordination: Coordination normal.     Gait: Gait normal.     Deep Tendon Reflexes: Reflexes are normal and symmetric. Reflexes normal.  Psychiatric:        Mood and Affect: Mood normal.        Behavior: Behavior normal.        Thought Content: Thought content normal.        Judgment: Judgment normal.           Assessment & Plan:  Benign essential HTN  Former smoker  Cardiac murmur  General medical exam  Pure hypercholesterolemia  Patient's blood pressure is excellent.  His most recent lab work is listed below: Lab on 08/22/2020  Component Date Value Ref Range Status  . WBC 08/22/2020 7.3  3.8 - 10.8 Thousand/uL Final  . RBC 08/22/2020 4.64  4.20 - 5.80 Million/uL Final  . Hemoglobin 08/22/2020 14.4  13.2 - 17.1 g/dL Final  . HCT 08/22/2020 42.9   38.5 - 50.0 % Final  .  MCV 08/22/2020 92.5  80.0 - 100.0 fL Final  . MCH 08/22/2020 31.0  27.0 - 33.0 pg Final  . MCHC 08/22/2020 33.6  32.0 - 36.0 g/dL Final  . RDW 08/22/2020 12.0  11.0 - 15.0 % Final  . Platelets 08/22/2020 251  140 - 400 Thousand/uL Final  . MPV 08/22/2020 12.3  7.5 - 12.5 fL Final  . Neutro Abs 08/22/2020 4,847  1,500 - 7,800 cells/uL Final  . Lymphs Abs 08/22/2020 1,628  850 - 3,900 cells/uL Final  . Absolute Monocytes 08/22/2020 650  200 - 950 cells/uL Final  . Eosinophils Absolute 08/22/2020 153  15 - 500 cells/uL Final  . Basophils Absolute 08/22/2020 22  0 - 200 cells/uL Final  . Neutrophils Relative % 08/22/2020 66.4  % Final  . Total Lymphocyte 08/22/2020 22.3  % Final  . Monocytes Relative 08/22/2020 8.9  % Final  . Eosinophils Relative 08/22/2020 2.1  % Final  . Basophils Relative 08/22/2020 0.3  % Final  . Glucose, Bld 08/22/2020 93  65 - 99 mg/dL Final   Comment: .            Fasting reference interval .   . BUN 08/22/2020 13  7 - 25 mg/dL Final  . Creat 08/22/2020 0.85  0.70 - 1.11 mg/dL Final   Comment: For patients >14 years of age, the reference limit for Creatinine is approximately 13% higher for people identified as African-American. .   . GFR, Est Non African American 08/22/2020 81  > OR = 60 mL/min/1.25m2 Final  . GFR, Est African American 08/22/2020 93  > OR = 60 mL/min/1.73m2 Final  . BUN/Creatinine Ratio 76/73/4193 NOT APPLICABLE  6 - 22 (calc) Final  . Sodium 08/22/2020 138  135 - 146 mmol/L Final  . Potassium 08/22/2020 4.1  3.5 - 5.3 mmol/L Final  . Chloride 08/22/2020 100  98 - 110 mmol/L Final  . CO2 08/22/2020 31  20 - 32 mmol/L Final  . Calcium 08/22/2020 9.2  8.6 - 10.3 mg/dL Final  . Total Protein 08/22/2020 7.3  6.1 - 8.1 g/dL Final  . Albumin 08/22/2020 4.1  3.6 - 5.1 g/dL Final  . Globulin 08/22/2020 3.2  1.9 - 3.7 g/dL (calc) Final  . AG Ratio 08/22/2020 1.3  1.0 - 2.5 (calc) Final  . Total Bilirubin 08/22/2020 1.5* 0.2  - 1.2 mg/dL Final  . Alkaline phosphatase (APISO) 08/22/2020 67  35 - 144 U/L Final  . AST 08/22/2020 16  10 - 35 U/L Final  . ALT 08/22/2020 12  9 - 46 U/L Final  . Cholesterol 08/22/2020 188  <200 mg/dL Final  . HDL 08/22/2020 47  > OR = 40 mg/dL Final  . Triglycerides 08/22/2020 85  <150 mg/dL Final  . LDL Cholesterol (Calc) 08/22/2020 123* mg/dL (calc) Final   Comment: Reference range: <100 . Desirable range <100 mg/dL for primary prevention;   <70 mg/dL for patients with CHD or diabetic patients  with > or = 2 CHD risk factors. Marland Kitchen LDL-C is now calculated using the Martin-Hopkins  calculation, which is a validated novel method providing  better accuracy than the Friedewald equation in the  estimation of LDL-C.  Cresenciano Genre et al. Annamaria Helling. 7902;409(73): 2061-2068  (http://education.[facility].com/faq/FAQ164)   . Total CHOL/HDL Ratio 08/22/2020 4.0  <5.0 (calc) Final  . Non-HDL Cholesterol (Calc) 08/22/2020 141* <130 mg/dL (calc) Final   Comment: For patients with diabetes plus 1 major ASCVD risk  factor, treating to a non-HDL-C goal of <100  mg/dL  (LDL-C of <70 mg/dL) is considered a therapeutic  option.    I will start the patient on Lipitor 20 mg a day due to his elevated LDL cholesterol, his age, and his hypertension to help reduce the risk of stroke.  I would like to see his LDL cholesterol less than 100.  I am concerned by his worsening cardiac murmur as I feel that he is developing moderate aortic stenosis.  Obtain an echocardiogram to evaluate further.  He received his flu shot.  The remainder of his immunizations are up-to-date.  He does not require cancer screening due to age.  He denies any falls, depression, or memory loss.

## 2020-09-21 ENCOUNTER — Ambulatory Visit (HOSPITAL_COMMUNITY): Payer: Medicare Other | Attending: Cardiology

## 2020-09-21 ENCOUNTER — Other Ambulatory Visit: Payer: Self-pay

## 2020-09-21 DIAGNOSIS — I10 Essential (primary) hypertension: Secondary | ICD-10-CM

## 2020-09-21 DIAGNOSIS — R011 Cardiac murmur, unspecified: Secondary | ICD-10-CM | POA: Insufficient documentation

## 2020-09-21 LAB — ECHOCARDIOGRAM COMPLETE
AR max vel: 1.71 cm2
AV Area VTI: 1.75 cm2
AV Area mean vel: 1.53 cm2
AV Mean grad: 11 mmHg
AV Peak grad: 20.6 mmHg
Ao pk vel: 2.27 m/s
Area-P 1/2: 3.21 cm2
P 1/2 time: 530 msec
S' Lateral: 3.1 cm

## 2020-09-29 ENCOUNTER — Encounter: Payer: Self-pay | Admitting: *Deleted

## 2020-10-11 ENCOUNTER — Telehealth: Payer: Self-pay | Admitting: Family Medicine

## 2020-10-11 NOTE — Telephone Encounter (Signed)
Pt. Daughter called to ask Dr. About results from echocardiogram.  UV#[id number]

## 2020-10-12 NOTE — Telephone Encounter (Signed)
Send to PCP  Nurse called multiple times, letter was sent. Daughter has questions

## 2020-10-20 NOTE — Telephone Encounter (Signed)
Spoke with Janett Billow, pt daughter, due to bad connect, sent mychart message letting her now to call the office and schedule mychart to discuss  concerns of father

## 2020-10-20 NOTE — Telephone Encounter (Signed)
I ordered echo due to murmur.  Overall, echo is excellent for his age, murmur is due to mild aortic stenosis which will not require any treatment.  Not clinically significant.    I would be glad to answer any specific questions she has.

## 2021-03-15 ENCOUNTER — Other Ambulatory Visit: Payer: Self-pay | Admitting: Family Medicine

## 2021-08-02 ENCOUNTER — Other Ambulatory Visit: Payer: Self-pay | Admitting: Family Medicine

## 2021-09-08 ENCOUNTER — Other Ambulatory Visit: Payer: Self-pay | Admitting: Family Medicine

## 2021-09-15 ENCOUNTER — Ambulatory Visit: Payer: Medicare Other | Admitting: Family Medicine

## 2021-09-21 ENCOUNTER — Ambulatory Visit: Payer: Medicare Other | Admitting: Family Medicine

## 2021-09-26 ENCOUNTER — Other Ambulatory Visit: Payer: Self-pay

## 2021-09-26 ENCOUNTER — Ambulatory Visit (INDEPENDENT_AMBULATORY_CARE_PROVIDER_SITE_OTHER): Payer: Medicare Other | Admitting: Family Medicine

## 2021-09-26 ENCOUNTER — Encounter: Payer: Self-pay | Admitting: Family Medicine

## 2021-09-26 VITALS — BP 128/72 | HR 66 | Temp 97.9°F | Resp 18 | Ht 66.0 in | Wt 205.0 lb

## 2021-09-26 DIAGNOSIS — R011 Cardiac murmur, unspecified: Secondary | ICD-10-CM

## 2021-09-26 DIAGNOSIS — E7800 Pure hypercholesterolemia, unspecified: Secondary | ICD-10-CM | POA: Diagnosis not present

## 2021-09-26 DIAGNOSIS — Z87891 Personal history of nicotine dependence: Secondary | ICD-10-CM

## 2021-09-26 DIAGNOSIS — I10 Essential (primary) hypertension: Secondary | ICD-10-CM | POA: Diagnosis not present

## 2021-09-26 LAB — CBC WITH DIFFERENTIAL/PLATELET
Absolute Monocytes: 658 cells/uL (ref 200–950)
Basophils Absolute: 21 cells/uL (ref 0–200)
Basophils Relative: 0.3 %
Eosinophils Absolute: 119 cells/uL (ref 15–500)
Eosinophils Relative: 1.7 %
HCT: 42.9 % (ref 38.5–50.0)
Hemoglobin: 14.8 g/dL (ref 13.2–17.1)
Lymphs Abs: 1645 cells/uL (ref 850–3900)
MCH: 31.8 pg (ref 27.0–33.0)
MCHC: 34.5 g/dL (ref 32.0–36.0)
MCV: 92.3 fL (ref 80.0–100.0)
MPV: 12 fL (ref 7.5–12.5)
Monocytes Relative: 9.4 %
Neutro Abs: 4557 cells/uL (ref 1500–7800)
Neutrophils Relative %: 65.1 %
Platelets: 232 10*3/uL (ref 140–400)
RBC: 4.65 10*6/uL (ref 4.20–5.80)
RDW: 12.3 % (ref 11.0–15.0)
Total Lymphocyte: 23.5 %
WBC: 7 10*3/uL (ref 3.8–10.8)

## 2021-09-26 LAB — COMPLETE METABOLIC PANEL WITH GFR
AG Ratio: 1.5 (calc) (ref 1.0–2.5)
ALT: 13 U/L (ref 9–46)
AST: 17 U/L (ref 10–35)
Albumin: 4.3 g/dL (ref 3.6–5.1)
Alkaline phosphatase (APISO): 71 U/L (ref 35–144)
BUN: 13 mg/dL (ref 7–25)
CO2: 29 mmol/L (ref 20–32)
Calcium: 9.1 mg/dL (ref 8.6–10.3)
Chloride: 96 mmol/L — ABNORMAL LOW (ref 98–110)
Creat: 0.77 mg/dL (ref 0.70–1.22)
Globulin: 2.9 g/dL (calc) (ref 1.9–3.7)
Glucose, Bld: 96 mg/dL (ref 65–99)
Potassium: 3.7 mmol/L (ref 3.5–5.3)
Sodium: 134 mmol/L — ABNORMAL LOW (ref 135–146)
Total Bilirubin: 1.6 mg/dL — ABNORMAL HIGH (ref 0.2–1.2)
Total Protein: 7.2 g/dL (ref 6.1–8.1)
eGFR: 88 mL/min/{1.73_m2} (ref >=60)

## 2021-09-26 LAB — LIPID PANEL
Cholesterol: 127 mg/dL (ref <200)
HDL: 51 mg/dL (ref >=40)
LDL Cholesterol (Calc): 58 mg/dL (calc)
Non-HDL Cholesterol (Calc): 76 mg/dL (calc) (ref <130)
Total CHOL/HDL Ratio: 2.5 (calc) (ref <5.0)
Triglycerides: 99 mg/dL (ref <150)

## 2021-09-26 NOTE — Progress Notes (Signed)
Subjective:    Patient ID: Christopher Bowen, male    DOB: 10-28-1934, 87 y.o.   MRN: [id number]  HPI Patient is a very pleasant 87 year old Caucasian gentleman here today for checkup.  He has hypertension for which he takes hydrochlorothiazide and amlodipine.  His blood pressure today is outstanding.  He denies any cramps on hydrochlorothiazide or swelling on the amlodipine.  He denies any chest pain shortness of breath or dyspnea on exertion.  He denies any orthopnea.  He denies any muscle aches on the atorvastatin that he takes for hyperlipidemia.  He denies any right upper quadrant pain.  He does report some constipation but this typically resolves with Ex-Lax.  He does have a 3/6 systolic ejection murmur heard best over the aortic valve.  I ordered an echocardiogram last year and the results are included below under his physical exam.  He does show some mild aortic stenosis but nothing serious.  He is completely asymptomatic. Past Medical History:  Diagnosis Date   Anemia    Diverticulosis    Former smoker    Hyperlipidemia    Hypertension     Past Surgical History:  Procedure Laterality Date   COLONOSCOPY Left 03/28/2014   Procedure: COLONOSCOPY;  Surgeon: Arta Silence, MD;  Location: [facility];  Service: Endoscopy;  Laterality: Left;   Current Outpatient Medications on File Prior to Visit  Medication Sig Dispense Refill   amLODipine (NORVASC) 10 MG tablet TAKE 1 TABLET BY MOUTH ONCE DAILY . APPOINTMENT REQUIRED FOR FUTURE REFILLS 30 tablet 0   atorvastatin (LIPITOR) 20 MG tablet Take 1 tablet (20 mg total) by mouth daily. 90 tablet 3   hydrochlorothiazide (HYDRODIURIL) 25 MG tablet TAKE 1 TABLET BY MOUTH ONCE DAILY . APPOINTMENT REQUIRED FOR FUTURE REFILLS 30 tablet 0   No current facility-administered medications on file prior to visit.   Allergies  Allergen Reactions   Penicillins Anaphylaxis   Social History   Socioeconomic History   Marital status: Unknown    Spouse  name: Not on file   Number of children: Not on file   Years of education: Not on file   Highest education level: Not on file  Occupational History   Not on file  Tobacco Use   Smoking status: Former    Packs/day: 0.50    Types: Cigarettes    Quit date: 09/17/1968    Years since quitting: 53.0   Smokeless tobacco: Never  Substance and Sexual Activity   Alcohol use: No   Drug use: No   Sexual activity: Not on file  Other Topics Concern   Not on file  Social History Narrative   Lives alone, and does not use assist device, no home health services. 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     Social Determinants of Health   Financial Resource Strain: Not on file  Food Insecurity: Not on file  Transportation Needs: Not on file  Physical Activity: Not on file  Stress: Not on file  Social Connections: Not on file  Intimate Partner Violence: Not on file   Family History  Problem Relation Age of Onset   Colon cancer Neg Hx    Ulcerative colitis Neg Hx    Crohn's disease Neg Hx    Diabetes Mother    Cerebral aneurysm Mother    Heart disease Father        age 78   Diabetes Brother       Review of Systems  All other systems reviewed and  are negative.     Objective:   Physical Exam Vitals reviewed.  Constitutional:      General: He is not in acute distress.    Appearance: Normal appearance. He is well-developed. He is obese. He is not diaphoretic.  HENT:     Head: Normocephalic and atraumatic.     Right Ear: Tympanic membrane, ear canal and external ear normal.     Left Ear: Tympanic membrane, ear canal and external ear normal.     Nose: Nose normal. No congestion or rhinorrhea.     Mouth/Throat:     Pharynx: No oropharyngeal exudate.  Eyes:     General: No scleral icterus.       Right eye: No discharge.        Left eye: No discharge.     Conjunctiva/sclera: Conjunctivae normal.     Pupils: Pupils are equal, round, and reactive to light.  Neck:     Thyroid: No  thyromegaly.     Vascular: No JVD.     Trachea: No tracheal deviation.  Cardiovascular:     Rate and Rhythm: Normal rate and regular rhythm.     Heart sounds: Murmur heard.  Systolic murmur is present with a grade of 3/6.    No friction rub. No gallop.  Pulmonary:     Effort: Pulmonary effort is normal. No respiratory distress.     Breath sounds: Normal breath sounds. No stridor. No wheezing, rhonchi or rales.  Chest:     Chest wall: No tenderness.  Abdominal:     General: Bowel sounds are normal. There is no distension.     Palpations: Abdomen is soft. There is no mass.     Tenderness: There is no abdominal tenderness. There is no guarding or rebound.  Musculoskeletal:        General: No tenderness.     Cervical back: Normal range of motion and neck supple.     Thoracic back: No spasms or tenderness. Normal range of motion.     Lumbar back: No spasms, tenderness or bony tenderness. Decreased range of motion.     Right lower leg: No edema.  Lymphadenopathy:     Cervical: No cervical adenopathy.  Skin:    General: Skin is warm.     Coloration: Skin is not jaundiced or pale.     Findings: No bruising, erythema, lesion or rash.  Neurological:     Mental Status: He is alert and oriented to person, place, and time.     Cranial Nerves: No cranial nerve deficit.     Sensory: No sensory deficit.     Motor: No weakness or abnormal muscle tone.     Coordination: Coordination normal.     Gait: Gait normal.     Deep Tendon Reflexes: Reflexes are normal and symmetric. Reflexes normal.  Psychiatric:        Mood and Affect: Mood normal.        Behavior: Behavior normal.        Thought Content: Thought content normal.        Judgment: Judgment normal.     Echo last year: IMPRESSIONS     1. Left ventricular ejection fraction, by estimation, is 60 to 65%. The  left ventricle has normal function. The left ventricle has no regional  wall motion abnormalities. Left ventricular diastolic  parameters are  consistent with Grade I diastolic  dysfunction (impaired relaxation). The average left ventricular global  longitudinal strain is -22.8 %. The global longitudinal  strain is normal.   2. Right ventricular systolic function is normal. The right ventricular  size is mildly enlarged. There is normal pulmonary artery systolic  pressure.   3. The mitral valve is normal in structure. No evidence of mitral valve  regurgitation. No evidence of mitral stenosis.   4. The aortic valve is tricuspid. There is mild calcification of the  aortic valve. There is mild thickening of the aortic valve. Aortic valve  regurgitation is trivial. Mild aortic valve stenosis. Aortic valve mean  gradient measures 11.0 mmHg. Aortic  valve Vmax measures 2.27 m/s.   5. Aortic dilatation noted. There is borderline dilatation of the  ascending aorta, measuring 38 mm.   6. The inferior vena cava is normal in size with greater than 50%  respiratory variability, suggesting right atrial pressure of 3 mmHg.     Assessment & Plan:  Benign essential HTN - Plan: CBC with Differential/Platelet, Lipid panel, COMPLETE METABOLIC PANEL WITH GFR  Former smoker  Cardiac murmur  Pure hypercholesterolemia - Plan: CBC with Differential/Platelet, Lipid panel, COMPLETE METABOLIC PANEL WITH GFR Blood pressure today is outstanding.  I will make no changes in his amlodipine or hydrochlorothiazide.  Check a CMP and a fasting lipid panel.  Goal LDL cholesterol is less than 100.  I did encourage the patient to try MiraLAX for constipation.  Otherwise he is asymptomatic and doing quite well.  No changes are necessary at this time.

## 2021-10-06 ENCOUNTER — Ambulatory Visit (INDEPENDENT_AMBULATORY_CARE_PROVIDER_SITE_OTHER): Payer: Medicare Other

## 2021-10-06 VITALS — Ht 70.0 in | Wt 205.0 lb

## 2021-10-06 DIAGNOSIS — Z0000 Encounter for general adult medical examination without abnormal findings: Secondary | ICD-10-CM | POA: Diagnosis not present

## 2021-10-06 NOTE — Progress Notes (Signed)
Subjective:   Christopher Bowen is a 87 y.o. male who presents for Medicare Annual/Subsequent preventive examination. Virtual Visit via Telephone Note  I connected with  Christopher Bowen on 10/06/21 at  3:30 PM EST by telephone and verified that I am speaking with the correct person using two identifiers.  Location: Patient: HOME Provider: [facility] Persons participating in the virtual visit: patient/Nurse Health Advisor   I discussed the limitations, risks, security and privacy concerns of performing an evaluation and management service by telephone and the availability of in person appointments. The patient expressed understanding and agreed to proceed.  Interactive audio and video telecommunications were attempted between this nurse and patient, however failed, due to patient having technical difficulties OR patient did not have access to video capability.  We continued and completed visit with audio only.  Some vital signs may be absent or patient reported.   [name], LPN  Review of Systems     Cardiac Risk Factors include: advanced age (>33men, >66 women);male gender;sedentary lifestyle;Other (see comment), Risk factor comments: anemia, thrombocytopenia  PHONE VISIT. PT AT HOME, NURSE AT [facility].    Objective:    Today's Vitals   10/06/21 1526  Weight: 205 lb (93 kg)  Height: 5\' 10"  (1.778 m)   Body mass index is 29.41 kg/m.  Advanced Directives 10/06/2021 08/25/2020 03/27/2014  Does Patient Have a Medical Advance Directive? Yes Yes Patient does not have advance directive;Patient would like information  Type of Advance Directive [place];Living will - -  Copy of [place] in Chart? No - copy requested - -    Current Medications (verified) Outpatient Encounter Medications as of 10/06/2021  Medication Sig   amLODipine (NORVASC) 10 MG tablet TAKE 1 TABLET BY MOUTH ONCE DAILY . APPOINTMENT REQUIRED FOR FUTURE REFILLS   atorvastatin  (LIPITOR) 20 MG tablet Take 1 tablet (20 mg total) by mouth daily.   hydrochlorothiazide (HYDRODIURIL) 25 MG tablet TAKE 1 TABLET BY MOUTH ONCE DAILY . APPOINTMENT REQUIRED FOR FUTURE REFILLS   No facility-administered encounter medications on file as of 10/06/2021.    Allergies (verified) Penicillins   History: Past Medical History:  Diagnosis Date   Anemia    Diverticulosis    Former smoker    Hyperlipidemia    Hypertension    Past Surgical History:  Procedure Laterality Date   COLONOSCOPY Left 03/28/2014   Procedure: COLONOSCOPY;  Surgeon: Arta Silence, MD;  Location: [facility];  Service: Endoscopy;  Laterality: Left;   Family History  Problem Relation Age of Onset   Colon cancer Neg Hx    Ulcerative colitis Neg Hx    Crohn's disease Neg Hx    Diabetes Mother    Cerebral aneurysm Mother    Heart disease Father        age 32   Diabetes Brother    Social History   Socioeconomic History   Marital status: Divorced    Spouse name: Not on file   Number of children: 6   Years of education: Not on file   Highest education level: Not on file  Occupational History   Not on file  Tobacco Use   Smoking status: Former    Packs/day: 0.50    Types: Cigarettes    Quit date: 09/17/1968    Years since quitting: 53.0   Smokeless tobacco: Never  Substance and Sexual Activity   Alcohol use: No   Drug use: No   Sexual activity: Not on file  Other Topics Concern   Not on file  Social History Narrative   Lives alone, and does not use assist device, no home health services. 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     Social Determinants of Health   Financial Resource Strain: Low Risk    Difficulty of Paying Living Expenses: Not hard at all  Food Insecurity: No Food Insecurity   Worried About Charity fundraiser in the Last Year: Never true   [place] in the Last Year: Never true  Transportation Needs: No Transportation Needs   Lack of Transportation (Medical): No    Lack of Transportation (Non-Medical): No  Physical Activity: Sufficiently Active   Days of Exercise per Week: 5 days   Minutes of Exercise per Session: 30 min  Stress: No Stress Concern Present   Feeling of Stress : Not at all  Social Connections: Socially Isolated   Frequency of Communication with Friends and Family: More than three times a week   Frequency of Social Gatherings with Friends and Family: More than three times a week   Attends Religious Services: Never   Marine scientist or Organizations: No   Attends Music therapist: Never   Marital Status: Divorced    Tobacco Counseling Counseling given: Not Answered   Clinical Intake:  Pre-visit preparation completed: Yes  Pain : No/denies pain     BMI - recorded: 29.41 Nutritional Status: BMI 25 -29 Overweight Nutritional Risks: None Diabetes: No  How often do you need to have someone help you when you read instructions, pamphlets, or other written materials from your doctor or pharmacy?: 1 - Never  Diabetic?NO  Interpreter Needed?: No  Information entered by :: mj Brett Darko, lpn   Activities of Daily Living In your present state of health, do you have any difficulty performing the following activities: 10/06/2021  Hearing? Y  Vision? N  Difficulty concentrating or making decisions? N  Walking or climbing stairs? N  Dressing or bathing? N  Doing errands, shopping? N  Preparing Food and eating ? N  Using the Toilet? N  In the past six months, have you accidently leaked urine? N  Do you have problems with loss of bowel control? N  Managing your Medications? N  Managing your Finances? N  Housekeeping or managing your Housekeeping? N  Some recent data might be hidden    Patient Care Team: Susy Frizzle, MD as PCP - General (Family Medicine)  Indicate any recent Medical Services you may have received from other than [facility] providers in the past year (date may be approximate).      Assessment:   This is a routine wellness examination for Christopher Bowen.  Hearing/Vision screen Hearing Screening - Comments:: Hearing issues. Vision Screening - Comments:: Readers.   Dietary issues and exercise activities discussed: Current Exercise Habits: The patient has a physically strenuous job, but has no regular exercise apart from work., Exercise limited by: cardiac condition(s)   Goals Addressed             This Visit's Progress    DIET - REDUCE CALORIE INTAKE         Depression Screen PHQ 2/9 Scores 10/06/2021 08/25/2020 08/18/2019 05/09/2018 10/04/2017 04/22/2014  PHQ - 2 Score 0 0 0 0 0 0    Fall Risk Fall Risk  10/06/2021 08/25/2020 08/18/2019 05/09/2018 10/04/2017  Falls in the past year? 0 0 0 No No  Number falls in past yr: 0 0 0 - -  Injury with Fall? 0 0 0 - -  Risk for fall due to : Impaired balance/gait - - - -  Follow up Falls prevention discussed - - - -    FALL RISK PREVENTION PERTAINING TO THE HOME:  Any stairs in or around the home? Yes  If so, are there any without handrails? No  Home free of loose throw rugs in walkways, pet beds, electrical cords, etc? Yes  Adequate lighting in your home to reduce risk of falls? Yes   ASSISTIVE DEVICES UTILIZED TO PREVENT FALLS:  Life alert? No  Use of a cane, walker or w/c? No  Grab bars in the bathroom? No  Shower chair or bench in shower? Yes  Elevated toilet seat or a handicapped toilet? No   TIMED UP AND GO:  Was the test performed? No .  Phone visit.  Cognitive Function:     6CIT Screen 08/25/2020  What Year? 0 points  What month? 0 points  What time? 0 points  Count back from 20 0 points  Months in reverse 0 points  Repeat phrase 0 points  Total Score 0    Immunizations Immunization History  Administered Date(s) Administered   Fluad Quad(high Dose 65+) 08/18/2019, 08/25/2020   Influenza, High Dose Seasonal PF 10/04/2017   Influenza,inj,Quad PF,6+ Mos 06/21/2015   Influenza-Unspecified 07/18/2018    Pneumococcal Conjugate-13 04/22/2014   Pneumococcal Polysaccharide-23 04/03/2011    TDAP status: Due, Education has been provided regarding the importance of this vaccine. Advised may receive this vaccine at local pharmacy or Health Dept. Aware to provide a copy of the vaccination record if obtained from local pharmacy or Health Dept. Verbalized acceptance and understanding.  Flu Vaccine status: Due, Education has been provided regarding the importance of this vaccine. Advised may receive this vaccine at local pharmacy or Health Dept. Aware to provide a copy of the vaccination record if obtained from local pharmacy or Health Dept. Verbalized acceptance and understanding.  Pneumococcal vaccine status: Up to date  Covid-19 vaccine status: Completed vaccines  Qualifies for Shingles Vaccine? Yes   Zostavax completed No   Shingrix Completed?: No.    Education has been provided regarding the importance of this vaccine. Patient has been advised to call insurance company to determine out of pocket expense if they have not yet received this vaccine. Advised may also receive vaccine at local pharmacy or Health Dept. Verbalized acceptance and understanding.  Screening Tests Health Maintenance  Topic Date Due   COVID-19 Vaccine (1) Never done   TETANUS/TDAP  Never done   Zoster Vaccines- Shingrix (1 of 2) Never done   INFLUENZA VACCINE  04/17/2021   Pneumonia Vaccine 26+ Years old  Completed   HPV VACCINES  Aged Out    Health Maintenance  Health Maintenance Due  Topic Date Due   COVID-19 Vaccine (1) Never done   TETANUS/TDAP  Never done   Zoster Vaccines- Shingrix (1 of 2) Never done   INFLUENZA VACCINE  04/17/2021    Colorectal cancer screening: Type of screening: Colonoscopy. Completed 03/28/2014. Repeat every 10 years  Lung Cancer Screening: (Low Dose CT Chest recommended if Age 92-80 years, 30 pack-year currently smoking OR have quit w/in 15years.) does not qualify.    Additional  Screening:  Hepatitis C Screening: does not qualify.  Vision Screening: Recommended annual ophthalmology exams for early detection of glaucoma and other disorders of the eye. Is the patient up to date with their annual eye exam?  No  Who is the  provider or what is the name of the office in which the patient attends annual eye exams? N/A If pt is not established with a provider, would they like to be referred to a provider to establish care? No .   Dental Screening: Recommended annual dental exams for proper oral hygiene  Community Resource Referral / Chronic Care Management: CRR required this visit?  No   CCM required this visit?  No      Plan:     I have personally reviewed and noted the following in the patients chart:   Medical and social history Use of alcohol, tobacco or illicit drugs  Current medications and supplements including opioid prescriptions. Patient is not currently taking opioid prescriptions. Functional ability and status Nutritional status Physical activity Advanced directives List of other physicians Hospitalizations, surgeries, and ER visits in previous 12 months Vitals Screenings to include cognitive, depression, and falls Referrals and appointments  In addition, I have reviewed and discussed with patient certain preventive protocols, quality metrics, and best practice recommendations. A written personalized care plan for preventive services as well as general preventive health recommendations were provided to patient.     [name], LPN   5/00/3704   Nurse Notes: Pt declines Optometry referral at this time. Discussed Shingles, Flu and Tdap vaccines and how to obtain. Pt states he has has Covid vaccines. Advised pt to bring copy of card to next visit for dates to be entered. Pt verbalized understanding.

## 2021-10-06 NOTE — Patient Instructions (Signed)
Christopher Bowen , Thank you for taking time to come for your Medicare Wellness Visit. I appreciate your ongoing commitment to your health goals. Please review the following plan we discussed and let me know if I can assist you in the future.   Screening recommendations/referrals: Colonoscopy: Done 03/28/2014. No longer required due to age.  Recommended yearly ophthalmology/optometry visit for glaucoma screening and checkup Recommended yearly dental visit for hygiene and checkup  Vaccinations: Influenza vaccine: Done 08/25/2020 Repeat annually  Pneumococcal vaccine: Done 04/03/2011  and 04/22/2014 Tdap vaccine: Due. Repeat in 10 years  Shingles vaccine: Shingrix discussed. Please contact your pharmacy for coverage information.     Covid-19: Please bring copy of vaccine card so dates can be documented in chart. Thank you!  Advanced directives: Please bring a copy of your health care power of attorney and living will to the office to be added to your chart at your convenience.   Conditions/risks identified: Aim for 30 minutes of exercise or brisk walking each day, drink 6-8 glasses of water and eat lots of fruits and vegetables.   Next appointment: Follow up in one year for your annual wellness visit. 2024   Preventive Care 65 Years and Older, Male  Preventive care refers to lifestyle choices and visits with your health care provider that can promote health and wellness. What does preventive care include? A yearly physical exam. This is also called an annual well check. Dental exams once or twice a year. Routine eye exams. Ask your health care provider how often you should have your eyes checked. Personal lifestyle choices, including: Daily care of your teeth and gums. Regular physical activity. Eating a healthy diet. Avoiding tobacco and drug use. Limiting alcohol use. Practicing safe sex. Taking low doses of aspirin every day. Taking vitamin and mineral supplements as recommended by  your health care provider. What happens during an annual well check? The services and screenings done by your health care provider during your annual well check will depend on your age, overall health, lifestyle risk factors, and family history of disease. Counseling  Your health care provider may ask you questions about your: Alcohol use. Tobacco use. Drug use. Emotional well-being. Home and relationship well-being. Sexual activity. Eating habits. History of falls. Memory and ability to understand (cognition). Work and work Statistician. Screening  You may have the following tests or measurements: Height, weight, and BMI. Blood pressure. Lipid and cholesterol levels. These may be checked every 5 years, or more frequently if you are over 55 years old. Skin check. Lung cancer screening. You may have this screening every year starting at age 66 if you have a 30-pack-year history of smoking and currently smoke or have quit within the past 15 years. Fecal occult blood test (FOBT) of the stool. You may have this test every year starting at age 68. Flexible sigmoidoscopy or colonoscopy. You may have a sigmoidoscopy every 5 years or a colonoscopy every 10 years starting at age 42. Prostate cancer screening. Recommendations will vary depending on your family history and other risks. Hepatitis C blood test. Hepatitis B blood test. Sexually transmitted disease (STD) testing. Diabetes screening. This is done by checking your blood sugar (glucose) after you have not eaten for a while (fasting). You may have this done every 1-3 years. Abdominal aortic aneurysm (AAA) screening. You may need this if you are a current or former smoker. Osteoporosis. You may be screened starting at age 70 if you are at high risk. Talk with your health  care provider about your test results, treatment options, and if necessary, the need for more tests. Vaccines  Your health care provider may recommend certain vaccines,  such as: Influenza vaccine. This is recommended every year. Tetanus, diphtheria, and acellular pertussis (Tdap, Td) vaccine. You may need a Td booster every 10 years. Zoster vaccine. You may need this after age 36. Pneumococcal 13-valent conjugate (PCV13) vaccine. One dose is recommended after age 84. Pneumococcal polysaccharide (PPSV23) vaccine. One dose is recommended after age 3. Talk to your health care provider about which screenings and vaccines you need and how often you need them. This information is not intended to replace advice given to you by your health care provider. Make sure you discuss any questions you have with your health care provider. Document Released: 09/30/2015 Document Revised: 05/23/2016 Document Reviewed: 07/05/2015 Elsevier Interactive Patient Education  2017 [employer] Prevention in the Home Falls can cause injuries. They can happen to people of all ages. There are many things you can do to make your home safe and to help prevent falls. What can I do on the outside of my home? Regularly fix the edges of walkways and driveways and fix any cracks. Remove anything that might make you trip as you walk through a door, such as a raised step or threshold. Trim any bushes or trees on the path to your home. Use bright outdoor lighting. Clear any walking paths of anything that might make someone trip, such as rocks or tools. Regularly check to see if handrails are loose or broken. Make sure that both sides of any steps have handrails. Any raised decks and porches should have guardrails on the edges. Have any leaves, snow, or ice cleared regularly. Use sand or salt on walking paths during winter. Clean up any spills in your garage right away. This includes oil or grease spills. What can I do in the bathroom? Use night lights. Install grab bars by the toilet and in the tub and shower. Do not use towel bars as grab bars. Use non-skid mats or decals in the tub or  shower. If you need to sit down in the shower, use a plastic, non-slip stool. Keep the floor dry. Clean up any water that spills on the floor as soon as it happens. Remove soap buildup in the tub or shower regularly. Attach bath mats securely with double-sided non-slip rug tape. Do not have throw rugs and other things on the floor that can make you trip. What can I do in the bedroom? Use night lights. Make sure that you have a light by your bed that is easy to reach. Do not use any sheets or blankets that are too big for your bed. They should not hang down onto the floor. Have a firm chair that has side arms. You can use this for support while you get dressed. Do not have throw rugs and other things on the floor that can make you trip. What can I do in the kitchen? Clean up any spills right away. Avoid walking on wet floors. Keep items that you use a lot in easy-to-reach places. If you need to reach something above you, use a strong step stool that has a grab bar. Keep electrical cords out of the way. Do not use floor polish or wax that makes floors slippery. If you must use wax, use non-skid floor wax. Do not have throw rugs and other things on the floor that can make you trip. What can  I do with my stairs? Do not leave any items on the stairs. Make sure that there are handrails on both sides of the stairs and use them. Fix handrails that are broken or loose. Make sure that handrails are as long as the stairways. Check any carpeting to make sure that it is firmly attached to the stairs. Fix any carpet that is loose or worn. Avoid having throw rugs at the top or bottom of the stairs. If you do have throw rugs, attach them to the floor with carpet tape. Make sure that you have a light switch at the top of the stairs and the bottom of the stairs. If you do not have them, ask someone to add them for you. What else can I do to help prevent falls? Wear shoes that: Do not have high heels. Have  rubber bottoms. Are comfortable and fit you well. Are closed at the toe. Do not wear sandals. If you use a stepladder: Make sure that it is fully opened. Do not climb a closed stepladder. Make sure that both sides of the stepladder are locked into place. Ask someone to hold it for you, if possible. Clearly mark and make sure that you can see: Any grab bars or handrails. First and last steps. Where the edge of each step is. Use tools that help you move around (mobility aids) if they are needed. These include: Canes. Walkers. Scooters. Crutches. Turn on the lights when you go into a dark area. Replace any light bulbs as soon as they burn out. Set up your furniture so you have a clear path. Avoid moving your furniture around. If any of your floors are uneven, fix them. If there are any pets around you, be aware of where they are. Review your medicines with your doctor. Some medicines can make you feel dizzy. This can increase your chance of falling. Ask your doctor what other things that you can do to help prevent falls. This information is not intended to replace advice given to you by your health care provider. Make sure you discuss any questions you have with your health care provider. Document Released: 06/30/2009 Document Revised: 02/09/2016 Document Reviewed: 10/08/2014 Elsevier Interactive Patient Education  2017 [employer].

## 2021-10-24 ENCOUNTER — Other Ambulatory Visit: Payer: Self-pay | Admitting: Family Medicine

## 2021-11-18 ENCOUNTER — Other Ambulatory Visit: Payer: Self-pay | Admitting: Family Medicine

## 2022-04-03 ENCOUNTER — Other Ambulatory Visit: Payer: Self-pay | Admitting: Family Medicine

## 2022-04-30 ENCOUNTER — Ambulatory Visit (INDEPENDENT_AMBULATORY_CARE_PROVIDER_SITE_OTHER): Payer: Medicare Other | Admitting: Family Medicine

## 2022-04-30 VITALS — BP 132/76 | HR 56 | Temp 98.5°F | Ht 66.0 in | Wt 192.0 lb

## 2022-04-30 DIAGNOSIS — S60222A Contusion of left hand, initial encounter: Secondary | ICD-10-CM | POA: Diagnosis not present

## 2022-04-30 NOTE — Progress Notes (Signed)
Subjective:    Patient ID: Christopher Bowen, male    DOB: 1935-08-12, 87 y.o.   MRN: [id number]  Hand Injury    10 days ago, the patient dropped a pipe on his left hand striking him in the area shown above.  There is a large 2.5 cm subcutaneous soft spongy area below the scab.  He states that he was bleeding where the scab was.  He covered that area with rubbing alcohol and cleaned it well.  It stopped bleeding after the first day however the discoloration and swelling has not subsided.  He has no pain with range of motion of the [facility] joint of the thumb.  He has no pain over the anatomic snuffbox.  He has no pain at the first MCP joint.  He has painless range of motion in all the MCP joints, PIP joints, and DIP joints of the hand.  The hand is neurovascularly intact.  However there is a 2.5 cm subcutaneous collection of a spongy liquid material that feels like coagulated blood.   Past Medical History:  Diagnosis Date   Anemia    Diverticulosis    Former smoker    Hyperlipidemia    Hypertension     Past Surgical History:  Procedure Laterality Date   COLONOSCOPY Left 03/28/2014   Procedure: COLONOSCOPY;  Surgeon: Arta Silence, MD;  Location: [facility];  Service: Endoscopy;  Laterality: Left;   Current Outpatient Medications on File Prior to Visit  Medication Sig Dispense Refill   amLODipine (NORVASC) 10 MG tablet Take 1 tablet (10 mg total) by mouth daily. 90 tablet 3   atorvastatin (LIPITOR) 20 MG tablet TAKE ONE TABLET BY MOUTH ONE TIME DAILY 90 tablet 0   hydrochlorothiazide (HYDRODIURIL) 25 MG tablet Take 1 tablet (25 mg total) by mouth daily. 90 tablet 3   No current facility-administered medications on file prior to visit.   Allergies  Allergen Reactions   Penicillins Anaphylaxis   Social History   Socioeconomic History   Marital status: Divorced    Spouse name: Not on file   Number of children: 6   Years of education: Not on file   Highest education level: Not on file   Occupational History   Not on file  Tobacco Use   Smoking status: Former    Packs/day: 0.50    Types: Cigarettes    Quit date: 09/17/1968    Years since quitting: 53.6   Smokeless tobacco: Never  Substance and Sexual Activity   Alcohol use: No   Drug use: No   Sexual activity: Not on file  Other Topics Concern   Not on file  Social History Narrative   Lives alone, and does not use assist device, no home health services. 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     Social Determinants of Health   Financial Resource Strain: Low Risk  (10/06/2021)   Overall Financial Resource Strain (CARDIA)    Difficulty of Paying Living Expenses: Not hard at all  Food Insecurity: No Food Insecurity (10/06/2021)   Hunger Vital Sign    Worried About Running Out of Food in the Last Year: Never true    Ran Out of Food in the Last Year: Never true  Transportation Needs: No Transportation Needs (10/06/2021)   PRAPARE - Hydrologist (Medical): No    Lack of Transportation (Non-Medical): No  Physical Activity: Sufficiently Active (10/06/2021)   Exercise Vital Sign    Days of Exercise per  Week: 5 days    Minutes of Exercise per Session: 30 min  Stress: No Stress Concern Present (10/06/2021)   [place]    Feeling of Stress : Not at all  Social Connections: Socially Isolated (10/06/2021)   Social Connection and Isolation Panel [NHANES]    Frequency of Communication with Friends and Family: More than three times a week    Frequency of Social Gatherings with Friends and Family: More than three times a week    Attends Religious Services: Never    Marine scientist or Organizations: No    Attends Archivist Meetings: Never    Marital Status: Divorced  Human resources officer Violence: Not At Risk (10/06/2021)   Humiliation, Afraid, Rape, and Kick questionnaire    Fear of Current or Ex-Partner: No    Emotionally  Abused: No    Physically Abused: No    Sexually Abused: No   Family History  Problem Relation Age of Onset   Colon cancer Neg Hx    Ulcerative colitis Neg Hx    Crohn's disease Neg Hx    Diabetes Mother    Cerebral aneurysm Mother    Heart disease Father        age 53   Diabetes Brother       Review of Systems  All other systems reviewed and are negative.      Objective:   Physical Exam Vitals reviewed.  Constitutional:      General: He is not in acute distress.    Appearance: Normal appearance. He is well-developed. He is obese. He is not diaphoretic.  HENT:     Head: Normocephalic and atraumatic.  Neck:     Thyroid: No thyromegaly.     Vascular: No JVD.     Trachea: No tracheal deviation.  Cardiovascular:     Rate and Rhythm: Normal rate and regular rhythm.     Heart sounds: Murmur heard.     Systolic murmur is present with a grade of 3/6.     No friction rub. No gallop.  Pulmonary:     Effort: Pulmonary effort is normal. No respiratory distress.     Breath sounds: Normal breath sounds. No stridor. No wheezing, rhonchi or rales.  Chest:     Chest wall: No tenderness.  Musculoskeletal:        General: No tenderness.     Left hand: Deformity present. No lacerations, tenderness or bony tenderness. Normal range of motion. Normal strength. Normal sensation. Normal pulse.     Cervical back: Normal range of motion and neck supple.     Thoracic back: No spasms or tenderness. Normal range of motion.     Lumbar back: No spasms, tenderness or bony tenderness. Decreased range of motion.     Right lower leg: No edema.  Lymphadenopathy:     Cervical: No cervical adenopathy.  Skin:    General: Skin is warm.     Coloration: Skin is not jaundiced or pale.     Findings: Lesion present. No bruising, erythema or rash.  Neurological:     Mental Status: He is alert and oriented to person, place, and time.     Cranial Nerves: No cranial nerve deficit.     Sensory: No sensory  deficit.     Motor: No weakness or abnormal muscle tone.     Coordination: Coordination normal.     Gait: Gait normal.     Deep Tendon Reflexes:  Reflexes are normal and symmetric.  Psychiatric:        Mood and Affect: Mood normal.        Behavior: Behavior normal.        Thought Content: Thought content normal.        Judgment: Judgment normal.       Assessment & Plan:  Traumatic hematoma of left hand, initial encounter At this point he is asymptomatic.  There is no evidence of any infection.  There is no abscess that requires incision and drainage.  He is having no pain.  Therefore I recommended that he allow this hematoma to absorb spontaneously over the next 4 weeks.  If he develops signs or symptoms of infection or pain or anything changes he is to follow back up with me immediately but right now he is completely asymptomatic other than the clinical appearance.

## 2022-07-07 ENCOUNTER — Other Ambulatory Visit: Payer: Self-pay | Admitting: Family Medicine

## 2022-10-12 ENCOUNTER — Ambulatory Visit (INDEPENDENT_AMBULATORY_CARE_PROVIDER_SITE_OTHER): Payer: Medicare Other

## 2022-10-12 DIAGNOSIS — Z0000 Encounter for general adult medical examination without abnormal findings: Secondary | ICD-10-CM

## 2022-10-12 NOTE — Progress Notes (Signed)
Subjective:   Christopher Bowen is a 88 y.o. male who presents for Medicare Annual/Subsequent preventive examination.  Review of Systems     Cardiac Risk Factors include: obesity (BMI >30kg/m2);male gender     Objective:    There were no vitals filed for this visit. There is no height or weight on file to calculate BMI.     10/12/2022    3:44 PM 10/06/2021    3:31 PM 08/25/2020    8:33 AM 03/27/2014    5:00 PM  Advanced Directives  Does Patient Have a Medical Advance Directive? Yes Yes Yes Patient does not have advance directive;Patient would like information  Type of Advance Directive  [place];Living will    Copy of [place] in Chart?  No - copy requested      Current Medications (verified) Outpatient Encounter Medications as of 10/12/2022  Medication Sig   amLODipine (NORVASC) 10 MG tablet Take 1 tablet (10 mg total) by mouth daily.   atorvastatin (LIPITOR) 20 MG tablet TAKE ONE TABLET BY MOUTH ONE TIME DAILY   hydrochlorothiazide (HYDRODIURIL) 25 MG tablet Take 1 tablet (25 mg total) by mouth daily.   No facility-administered encounter medications on file as of 10/12/2022.    Allergies (verified) Penicillins   History: Past Medical History:  Diagnosis Date   Anemia    Diverticulosis    Former smoker    Hyperlipidemia    Hypertension    Past Surgical History:  Procedure Laterality Date   COLONOSCOPY Left 03/28/2014   Procedure: COLONOSCOPY;  Surgeon: Arta Silence, MD;  Location: [facility];  Service: Endoscopy;  Laterality: Left;   Family History  Problem Relation Age of Onset   Colon cancer Neg Hx    Ulcerative colitis Neg Hx    Crohn's disease Neg Hx    Diabetes Mother    Cerebral aneurysm Mother    Heart disease Father        age 20   Diabetes Brother    Social History   Socioeconomic History   Marital status: Divorced    Spouse name: Not on file   Number of children: 6   Years of education: Not on file    Highest education level: Not on file  Occupational History   Not on file  Tobacco Use   Smoking status: Former    Packs/day: 0.50    Types: Cigarettes    Quit date: 09/17/1968    Years since quitting: 54.1   Smokeless tobacco: Never  Substance and Sexual Activity   Alcohol use: No   Drug use: No   Sexual activity: Not on file  Other Topics Concern   Not on file  Social History Narrative   Lives alone, and does not use assist device, no home health services. 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     Social Determinants of Health   Financial Resource Strain: Low Risk  (10/12/2022)   Overall Financial Resource Strain (CARDIA)    Difficulty of Paying Living Expenses: Not very hard  Food Insecurity: No Food Insecurity (10/12/2022)   Hunger Vital Sign    Worried About Running Out of Food in the Last Year: Never true    Ran Out of Food in the Last Year: Never true  Transportation Needs: Unknown (10/12/2022)   PRAPARE - Hydrologist (Medical): Not on file    Lack of Transportation (Non-Medical): No  Physical Activity: Inactive (10/12/2022)   Exercise Vital  Sign    Days of Exercise per Week: 0 days    Minutes of Exercise per Session: 0 min  Stress: No Stress Concern Present (10/12/2022)   [place]    Feeling of Stress : Not at all  Social Connections: Moderately Isolated (10/12/2022)   Social Connection and Isolation Panel [NHANES]    Frequency of Communication with Friends and Family: Not on file    Frequency of Social Gatherings with Friends and Family: Three times a week    Attends Religious Services: Never    Active Member of Clubs or Organizations: Yes    Attends Music therapist: More than 4 times per year    Marital Status: Divorced    Tobacco Counseling Counseling given: Not Answered   Clinical Intake:  Pre-visit preparation completed: Yes  Pain : No/denies pain      BMI - recorded: 31 Nutritional Status: BMI > 30  Obese Diabetes: No  How often do you need to have someone help you when you read instructions, pamphlets, or other written materials from your doctor or pharmacy?: 1 - Never What is the last grade level you completed in school?: 11  Diabetic?n/a  Interpreter Needed?: No      Activities of Daily Living    10/12/2022    3:47 PM  In your present state of health, do you have any difficulty performing the following activities:  Hearing? 1  Vision? 1  Comment only to read  Difficulty concentrating or making decisions? 0  Walking or climbing stairs? 0  Dressing or bathing? 0  Doing errands, shopping? 0  Preparing Food and eating ? N  Using the Toilet? N  In the past six months, have you accidently leaked urine? N  Do you have problems with loss of bowel control? N  Managing your Medications? Y  Managing your Finances? Y  Housekeeping or managing your Housekeeping? Y    Patient Care Team: Susy Frizzle, MD as PCP - General (Family Medicine)  Indicate any recent Medical Services you may have received from other than [facility] providers in the past year (date may be approximate).     Assessment:   This is a routine wellness examination for Christopher Bowen.  Hearing/Vision screen No results found.  Dietary issues and exercise activities discussed:     Goals Addressed             This Visit's Progress    DIET - REDUCE CALORIE INTAKE   On track      Depression Screen    10/12/2022    3:43 PM 10/06/2021    3:28 PM 08/25/2020    8:34 AM 08/18/2019    4:16 PM 05/09/2018    3:53 PM 10/04/2017    8:18 AM 04/22/2014    9:18 AM  PHQ 2/9 Scores  PHQ - 2 Score 0 0 0 0 0 0 0    Fall Risk    10/12/2022    3:45 PM 10/06/2021    3:31 PM 08/25/2020    8:33 AM 08/18/2019    4:16 PM 05/09/2018    3:53 PM  [place] in the past year? 0 0 0 0 No  Number falls in past yr: 0 0 0 0   Injury with Fall? 0 0 0 0   Risk for fall due  to :  Impaired balance/gait     Follow up  Falls prevention discussed  FALL RISK PREVENTION PERTAINING TO THE HOME:  Any stairs in or around the home? No  If so, are there any without handrails? No  Home free of loose throw rugs in walkways, pet beds, electrical cords, etc? Yes  Adequate lighting in your home to reduce risk of falls? Yes   ASSISTIVE DEVICES UTILIZED TO PREVENT FALLS:  Life alert? No  Use of a cane, walker or w/c? No  Grab bars in the bathroom? Yes  Shower chair or bench in shower? Yes  Elevated toilet seat or a handicapped toilet? No   TIMED UP AND GO:  Was the test performed? No .  Length of time to ambulate 10 feet: n/a sec.     Cognitive Function:        10/12/2022    3:45 PM 08/25/2020    8:34 AM  6CIT Screen  What Year? 0 points 0 points  What month? 0 points 0 points  What time? 0 points 0 points  Count back from 20 0 points 0 points  Months in reverse 0 points 0 points  Repeat phrase 0 points 0 points  Total Score 0 points 0 points    Immunizations Immunization History  Administered Date(s) Administered   Fluad Quad(high Dose 65+) 08/18/2019, 08/25/2020   Influenza, High Dose Seasonal PF 10/04/2017   Influenza,inj,Quad PF,6+ Mos 06/21/2015   Influenza-Unspecified 07/18/2018   Pneumococcal Conjugate-13 04/22/2014   Pneumococcal Polysaccharide-23 04/03/2011    TDAP status: Due, Education has been provided regarding the importance of this vaccine. Advised may receive this vaccine at local pharmacy or Health Dept. Aware to provide a copy of the vaccination record if obtained from local pharmacy or Health Dept. Verbalized acceptance and understanding.  Flu Vaccine status: Due, Education has been provided regarding the importance of this vaccine. Advised may receive this vaccine at local pharmacy or Health Dept. Aware to provide a copy of the vaccination record if obtained from local pharmacy or Health Dept. Verbalized acceptance and  understanding.  Pneumococcal vaccine status: Up to date  Covid-19 vaccine status: Declined, Education has been provided regarding the importance of this vaccine but patient still declined. Advised may receive this vaccine at local pharmacy or Health Dept.or vaccine clinic. Aware to provide a copy of the vaccination record if obtained from local pharmacy or Health Dept. Verbalized acceptance and understanding.  Qualifies for Shingles Vaccine? No   Zostavax completed No   Shingrix Completed?: No.    Education has been provided regarding the importance of this vaccine. Patient has been advised to call insurance company to determine out of pocket expense if they have not yet received this vaccine. Advised may also receive vaccine at local pharmacy or Health Dept. Verbalized acceptance and understanding.  Screening Tests Health Maintenance  Topic Date Due   COVID-19 Vaccine (1) Never done   DTaP/Tdap/Td (1 - Tdap) Never done   Zoster Vaccines- Shingrix (1 of 2) Never done   INFLUENZA VACCINE  04/17/2022   Medicare Annual Wellness (AWV)  10/13/2023   Pneumonia Vaccine 5+ Years old  Completed   HPV VACCINES  Aged Out    Health Maintenance  Health Maintenance Due  Topic Date Due   COVID-19 Vaccine (1) Never done   DTaP/Tdap/Td (1 - Tdap) Never done   Zoster Vaccines- Shingrix (1 of 2) Never done   INFLUENZA VACCINE  04/17/2022    Colorectal cancer screening: No longer required.   Lung Cancer Screening: (Low Dose CT Chest recommended if Age 74-80 years, 42  pack-year currently smoking OR have quit w/in 15years.) does not qualify.   Lung Cancer Screening Referral: n/a  Additional Screening:  Hepatitis C Screening: does not qualify; Completed n/a  Vision Screening: Recommended annual ophthalmology exams for early detection of glaucoma and other disorders of the eye. Is the patient up to date with their annual eye exam?  No  Who is the provider or what is the name of the office in  which the patient attends annual eye exams? Per pt don't have one at the moment. Per pt only uses reader glasses, at this time per pt don't need appt If pt is not established with a provider, would they like to be referred to a provider to establish care? No .   Dental Screening: Recommended annual dental exams for proper oral hygiene  Community Resource Referral / Chronic Care Management: CRR required this visit?  No   CCM required this visit?  No      Plan:     I have personally reviewed and noted the following in the patient's chart:   Medical and social history Use of alcohol, tobacco or illicit drugs  Current medications and supplements including opioid prescriptions. Patient is not currently taking opioid prescriptions. Functional ability and status Nutritional status Physical activity Advanced directives List of other physicians Hospitalizations, surgeries, and ER visits in previous 12 months Vitals Screenings to include cognitive, depression, and falls Referrals and appointments  In addition, I have reviewed and discussed with patient certain preventive protocols, quality metrics, and best practice recommendations. A written personalized care plan for preventive services as well as general preventive health recommendations were provided to patient.  Virtual Visit via Telephone Note  I connected with Christopher Bowen on 10/12/22 at  3:30 PM EST by telephone and verified that I am speaking with the correct person using two identifiers.  Location: Patient: home Provider: clinic [facility].    I discussed the limitations, risks, security and privacy concerns of performing an evaluation and management service by telephone and the availability of in person appointments. I also discussed with the patient that there may be a patient responsible charge related to this service. The patient expressed understanding and agreed to proceed.   History of Present  Illness:    Observations/Objective:   Assessment and Plan:   Follow Up Instructions:    I discussed the assessment and treatment plan with the patient. The patient was provided an opportunity to ask questions and all were answered. The patient agreed with the plan and demonstrated an understanding of the instructions.   The patient was advised to call back or seek an in-person evaluation if the symptoms worsen or if the condition fails to improve as anticipated.  I provided 30 minutes of non-face-to-face time during this encounter.   Colman Cater, [place], [place]   10/12/2022   Nurse Notes: n/a

## 2022-10-19 ENCOUNTER — Ambulatory Visit: Payer: Medicare Other | Admitting: Family Medicine

## 2022-10-22 ENCOUNTER — Ambulatory Visit (INDEPENDENT_AMBULATORY_CARE_PROVIDER_SITE_OTHER): Payer: Medicare Other | Admitting: Family Medicine

## 2022-10-22 VITALS — BP 152/78 | HR 73 | Temp 98.4°F | Ht 67.0 in | Wt 197.4 lb

## 2022-10-22 DIAGNOSIS — E7800 Pure hypercholesterolemia, unspecified: Secondary | ICD-10-CM

## 2022-10-22 DIAGNOSIS — I10 Essential (primary) hypertension: Secondary | ICD-10-CM | POA: Diagnosis not present

## 2022-10-22 DIAGNOSIS — Z23 Encounter for immunization: Secondary | ICD-10-CM | POA: Diagnosis not present

## 2022-10-22 MED ORDER — LOSARTAN POTASSIUM 50 MG PO TABS: 50.0000 mg | ORAL_TABLET | Freq: Every day | ORAL | 3 refills | Status: DC

## 2022-10-22 NOTE — Progress Notes (Signed)
Subjective:    Patient ID: Christopher Bowen, male    DOB: 06/09/1935, 88 y.o.   MRN: [id number]  HPI Patient is a very pleasant 88 year old Caucasian gentleman here today for checkup.  He has hypertension.  Echo in 2022 revealed: Echo last year: 1. Left ventricular ejection fraction, by estimation, is 60 to 65%. The  left ventricle has normal function. The left ventricle has no regional  wall motion abnormalities. Left ventricular diastolic parameters are  consistent with Grade I diastolic  dysfunction (impaired relaxation). The average left ventricular global  longitudinal strain is -22.8 %. The global longitudinal strain is normal.   2. Right ventricular systolic function is normal. The right ventricular  size is mildly enlarged. There is normal pulmonary artery systolic  pressure.   3. The mitral valve is normal in structure. No evidence of mitral valve  regurgitation. No evidence of mitral stenosis.   4. The aortic valve is tricuspid. There is mild calcification of the  aortic valve. There is mild thickening of the aortic valve. Aortic valve  regurgitation is trivial. Mild aortic valve stenosis. Aortic valve mean  gradient measures 11.0 mmHg. Aortic  valve Vmax measures 2.27 m/s.   5. Aortic dilatation noted. There is borderline dilatation of the  ascending aorta, measuring 38 mm.   6. The inferior vena cava is normal in size with greater than 50%  respiratory variability, suggesting right atrial pressure of 3 mmHg. Patient states that recently his blood pressure has been elevated.  He has been 6  Pertinent.  He denies any chest pain, shortness of breath, dyspnea on exertion, orthopnea.  He denies eating a lot of sodium.  He denies any change in his medication.  He has not been taking arthritis pills or anything that would raise his blood pressure. Past Medical History:  Diagnosis Date   Anemia    Diverticulosis    Former smoker    Hyperlipidemia    Hypertension     Past  Surgical History:  Procedure Laterality Date   COLONOSCOPY Left 03/28/2014   Procedure: COLONOSCOPY;  Surgeon: Arta Silence, MD;  Location: [facility];  Service: Endoscopy;  Laterality: Left;   Current Outpatient Medications on File Prior to Visit  Medication Sig Dispense Refill   amLODipine (NORVASC) 10 MG tablet Take 1 tablet (10 mg total) by mouth daily. 90 tablet 3   atorvastatin (LIPITOR) 20 MG tablet TAKE ONE TABLET BY MOUTH ONE TIME DAILY 90 tablet 0   hydrochlorothiazide (HYDRODIURIL) 25 MG tablet Take 1 tablet (25 mg total) by mouth daily. 90 tablet 3   No current facility-administered medications on file prior to visit.   Allergies  Allergen Reactions   Penicillins Anaphylaxis   Social History   Socioeconomic History   Marital status: Divorced    Spouse name: Not on file   Number of children: 6   Years of education: Not on file   Highest education level: Not on file  Occupational History   Not on file  Tobacco Use   Smoking status: Former    Packs/day: 0.50    Types: Cigarettes    Quit date: 09/17/1968    Years since quitting: 54.1   Smokeless tobacco: Never  Substance and Sexual Activity   Alcohol use: No   Drug use: No   Sexual activity: Not on file  Other Topics Concern   Not on file  Social History Narrative   Lives alone, and does not use assist device, no home health services. 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     Social Determinants of Health   Financial Resource Strain: Low Risk  (10/12/2022)   Overall Financial Resource Strain (CARDIA)    Difficulty of Paying Living Expenses: Not very hard  Food Insecurity: No Food Insecurity (10/12/2022)   Hunger Vital Sign    Worried About Running Out of Food in the Last Year: Never true    Ran Out of Food in the Last Year: Never true  Transportation Needs: Unknown (10/12/2022)   PRAPARE - Hydrologist (Medical): Not on file    Lack of Transportation (Non-Medical): No  Physical  Activity: Inactive (10/12/2022)   Exercise Vital Sign    Days of Exercise per Week: 0 days    Minutes of Exercise per Session: 0 min  Stress: No Stress Concern Present (10/12/2022)   [place]    Feeling of Stress : Not at all  Social Connections: Moderately Isolated (10/12/2022)   Social Connection and Isolation Panel [NHANES]    Frequency of Communication with Friends and Family: Not on file    Frequency of Social Gatherings with Friends and Family: Three times a week    Attends Religious Services: Never    Active Member of Clubs or Organizations: Yes    Attends Archivist Meetings: More than 4 times per year    Marital Status: Divorced  Intimate Partner Violence: Not At Risk (10/12/2022)   Humiliation, Afraid, Rape, and Kick questionnaire    Fear of Current or Ex-Partner: No    Emotionally Abused: No    Physically Abused: No    Sexually Abused: No   Family History  Problem Relation Age of Onset   Colon cancer Neg Hx    Ulcerative colitis Neg Hx    Crohn's disease Neg Hx    Diabetes Mother    Cerebral aneurysm Mother    Heart disease Father        age 11   Diabetes Brother       Review of Systems  All other systems reviewed and are negative.      Objective:   Physical Exam Vitals reviewed.  Constitutional:      General: He is not in acute distress.    Appearance: Normal appearance. He is well-developed. He is obese. He is not diaphoretic.  HENT:     Head: Normocephalic and atraumatic.     Right Ear: Tympanic membrane, ear canal and external ear normal.     Left Ear: Tympanic membrane, ear canal and external ear normal.     Nose: Nose normal. No congestion or rhinorrhea.     Mouth/Throat:     Pharynx: No oropharyngeal exudate.  Eyes:     General: No scleral icterus.       Right eye: No discharge.        Left eye: No discharge.     Conjunctiva/sclera: Conjunctivae normal.     Pupils:  Pupils are equal, round, and reactive to light.  Neck:     Thyroid: No thyromegaly.     Vascular: No JVD.     Trachea: No tracheal deviation.  Cardiovascular:     Rate and Rhythm: Normal rate and regular rhythm.     Heart sounds: Murmur heard.     Systolic murmur is present with a grade of 3/6.     No friction rub. No gallop.  Pulmonary:     Effort: Pulmonary effort  is normal. No respiratory distress.     Breath sounds: Normal breath sounds. No stridor. No wheezing, rhonchi or rales.  Chest:     Chest wall: No tenderness.  Abdominal:     General: Bowel sounds are normal. There is no distension.     Palpations: Abdomen is soft. There is no mass.     Tenderness: There is no abdominal tenderness. There is no guarding or rebound.  Musculoskeletal:        General: No tenderness.     Cervical back: Normal range of motion and neck supple.     Thoracic back: No spasms or tenderness. Normal range of motion.     Lumbar back: No spasms, tenderness or bony tenderness. Decreased range of motion.     Right lower leg: No edema.  Lymphadenopathy:     Cervical: No cervical adenopathy.  Skin:    General: Skin is warm.     Coloration: Skin is not jaundiced or pale.     Findings: No bruising, erythema, lesion or rash.  Neurological:     Mental Status: He is alert and oriented to person, place, and time.     Cranial Nerves: No cranial nerve deficit.     Sensory: No sensory deficit.     Motor: No weakness or abnormal muscle tone.     Coordination: Coordination normal.     Gait: Gait normal.     Deep Tendon Reflexes: Reflexes are normal and symmetric. Reflexes normal.  Psychiatric:        Mood and Affect: Mood normal.        Behavior: Behavior normal.        Thought Content: Thought content normal.        Judgment: Judgment normal.       Assessment & Plan:  Benign essential HTN - Plan: CBC with Differential/Platelet, Lipid panel, COMPLETE METABOLIC PANEL WITH GFR  Pure  hypercholesterolemia Patient is overdue for a flu shot so we gave him that today.  I will also check a CBC a CMP and a lipid panel.  I like to see his LDL cholesterol less than 100.  His systolic blood pressure is elevated so I will add losartan 50 mg a day to his amlodipine and his hydrochlorothiazide and recommended we recheck his blood pressure in 2 to 4 weeks.

## 2022-10-22 NOTE — Addendum Note (Signed)
Addended by: Randal Buba K on: 10/22/2022 11:22 AM   Modules accepted: Orders

## 2022-10-23 LAB — CBC WITH DIFFERENTIAL/PLATELET
Absolute Monocytes: 689 cells/uL (ref 200–950)
Basophils Absolute: 21 cells/uL (ref 0–200)
Basophils Relative: 0.3 %
Eosinophils Absolute: 128 cells/uL (ref 15–500)
Eosinophils Relative: 1.8 %
HCT: 42.6 % (ref 38.5–50.0)
Hemoglobin: 14.6 g/dL (ref 13.2–17.1)
Lymphs Abs: 1796 cells/uL (ref 850–3900)
MCH: 31.9 pg (ref 27.0–33.0)
MCHC: 34.3 g/dL (ref 32.0–36.0)
MCV: 93.2 fL (ref 80.0–100.0)
MPV: 11.3 fL (ref 7.5–12.5)
Monocytes Relative: 9.7 %
Neutro Abs: 4466 cells/uL (ref 1500–7800)
Neutrophils Relative %: 62.9 %
Platelets: 246 10*3/uL (ref 140–400)
RBC: 4.57 10*6/uL (ref 4.20–5.80)
RDW: 12.2 % (ref 11.0–15.0)
Total Lymphocyte: 25.3 %
WBC: 7.1 10*3/uL (ref 3.8–10.8)

## 2022-10-23 LAB — COMPLETE METABOLIC PANEL WITH GFR
AG Ratio: 1.2 (calc) (ref 1.0–2.5)
ALT: 17 U/L (ref 9–46)
AST: 19 U/L (ref 10–35)
Albumin: 4.1 g/dL (ref 3.6–5.1)
Alkaline phosphatase (APISO): 75 U/L (ref 35–144)
BUN: 20 mg/dL (ref 7–25)
CO2: 31 mmol/L (ref 20–32)
Calcium: 9.3 mg/dL (ref 8.6–10.3)
Chloride: 99 mmol/L (ref 98–110)
Creat: 0.89 mg/dL (ref 0.70–1.22)
Globulin: 3.3 g/dL (calc) (ref 1.9–3.7)
Glucose, Bld: 98 mg/dL (ref 65–99)
Potassium: 4.1 mmol/L (ref 3.5–5.3)
Sodium: 137 mmol/L (ref 135–146)
Total Bilirubin: 1.7 mg/dL — ABNORMAL HIGH (ref 0.2–1.2)
Total Protein: 7.4 g/dL (ref 6.1–8.1)
eGFR: 84 mL/min/{1.73_m2} (ref >=60)

## 2022-10-23 LAB — LIPID PANEL
Cholesterol: 152 mg/dL (ref <200)
HDL: 58 mg/dL (ref >=40)
LDL Cholesterol (Calc): 79 mg/dL (calc)
Non-HDL Cholesterol (Calc): 94 mg/dL (calc) (ref <130)
Total CHOL/HDL Ratio: 2.6 (calc) (ref <5.0)
Triglycerides: 71 mg/dL (ref <150)

## 2023-01-14 ENCOUNTER — Other Ambulatory Visit: Payer: Self-pay | Admitting: Family Medicine

## 2023-02-20 ENCOUNTER — Encounter: Payer: Self-pay | Admitting: Family Medicine

## 2023-02-20 ENCOUNTER — Ambulatory Visit (INDEPENDENT_AMBULATORY_CARE_PROVIDER_SITE_OTHER): Payer: Medicare Other | Admitting: Family Medicine

## 2023-02-20 VITALS — BP 110/62 | HR 75 | Temp 98.4°F | Ht 67.0 in | Wt 199.4 lb

## 2023-02-20 DIAGNOSIS — R350 Frequency of micturition: Secondary | ICD-10-CM

## 2023-02-20 DIAGNOSIS — B349 Viral infection, unspecified: Secondary | ICD-10-CM | POA: Diagnosis not present

## 2023-02-20 DIAGNOSIS — R5383 Other fatigue: Secondary | ICD-10-CM | POA: Insufficient documentation

## 2023-02-20 LAB — URINALYSIS, ROUTINE W REFLEX MICROSCOPIC
Bacteria, UA: NONE SEEN /HPF
Bilirubin Urine: NEGATIVE
Glucose, UA: NEGATIVE
Hyaline Cast: NONE SEEN /LPF
Ketones, ur: NEGATIVE
Leukocytes,Ua: NEGATIVE
Nitrite: NEGATIVE
Protein, ur: NEGATIVE
Specific Gravity, Urine: 1.015 (ref 1.001–1.035)
Squamous Epithelial / HPF: NONE SEEN /HPF (ref <=5)
WBC, UA: NONE SEEN /HPF (ref 0–5)
pH: 7 (ref 5.0–8.0)

## 2023-02-20 LAB — MICROSCOPIC MESSAGE

## 2023-02-20 LAB — CBC WITH DIFFERENTIAL/PLATELET
Basophils Relative: 0.4 %
Eosinophils Relative: 0.9 %
MCHC: 34.4 g/dL (ref 32.0–36.0)
MCV: 92.8 fL (ref 80.0–100.0)
Neutrophils Relative %: 71.7 %
Platelets: 283 10*3/uL (ref 140–400)

## 2023-02-20 NOTE — Assessment & Plan Note (Addendum)
I suspect he is most likely overcoming a viral illness. Will r/o anemia, EKG was normal, UA negative. Given his symptoms are overall improving I encouraged him to continue Tylenol PRN for body aches and push fluids. Return to office if symptoms persist or worsen.

## 2023-02-20 NOTE — Assessment & Plan Note (Signed)
EKG NSR, will obtain CBC.

## 2023-02-20 NOTE — Progress Notes (Addendum)
Subjective:  HPI: Christopher Bowen is a 88 y.o. male presenting on 02/20/2023 for Generalized Body Aches (Pt c/o body aches, headache and fatigue for 10 days per pt. Pt states he has had low BP readings today, 111/75. Pt states BP normally runs 130/70s at home. )   HPI Patient is in today for 10 days of low energy and body aches, chills, headaches, decreased appetite. His BP has been lower today than usual. He does endorse lightheadedness when standing a few times and mild sternal chest pain a few times. He also endorses urinary frequency. Described as "flu-like symptoms". He has felt better the last 2-3 days and does feel like he is overall improving. Denies fever, congestion, cough, ear pain, chest pain, abdominal pain, nausea, vomiting, diarrhea, shortness of breath, cough, sore throat, dysuria, ear pain, blood in stool.   Review of Systems  All other systems reviewed and are negative.   Relevant past medical history reviewed and updated as indicated.   Past Medical History:  Diagnosis Date   Anemia    Diverticulosis    Former smoker    Hyperlipidemia    Hypertension      Past Surgical History:  Procedure Laterality Date   COLONOSCOPY Left 03/28/2014   Procedure: COLONOSCOPY;  Surgeon: Willis Modena, MD;  Location: [facility];  Service: Endoscopy;  Laterality: Left;    Allergies and medications reviewed and updated.   Current Outpatient Medications:    amLODipine (NORVASC) 10 MG tablet, Take 1 tablet by mouth once daily, Disp: 90 tablet, Rfl: 0   atorvastatin (LIPITOR) 20 MG tablet, TAKE ONE TABLET BY MOUTH ONE TIME DAILY, Disp: 90 tablet, Rfl: 0   hydrochlorothiazide (HYDRODIURIL) 25 MG tablet, Take 1 tablet by mouth once daily, Disp: 90 tablet, Rfl: 0   losartan (COZAAR) 50 MG tablet, Take 1 tablet (50 mg total) by mouth daily., Disp: 90 tablet, Rfl: 3  Allergies  Allergen Reactions   Penicillins Anaphylaxis    Objective:   BP 110/62   Pulse 75   Temp 98.4 F (36.9  C)   Ht 5\' 7"  (1.702 m)   Wt 199 lb 6.4 oz (90.4 kg)   SpO2 97%   BMI 31.23 kg/m      02/20/2023   11:42 AM 10/22/2022    8:14 AM 04/30/2022    9:54 AM  Vitals with BMI  Height 5\' 7"  5\' 7"  5\' 6"   Weight 199 lbs 6 oz 197 lbs 6 oz 192 lbs  BMI 31.22 30.91 31  Systolic 110 152 409  Diastolic 62 78 76  Pulse 75 73 56     Physical Exam Vitals and nursing note reviewed.  Constitutional:      Appearance: Normal appearance. He is normal weight.  HENT:     Head: Normocephalic and atraumatic.     Right Ear: Tympanic membrane, ear canal and external ear normal.     Left Ear: Tympanic membrane, ear canal and external ear normal.     Nose: Nose normal.     Mouth/Throat:     Mouth: Mucous membranes are moist.     Pharynx: Oropharynx is clear.  Eyes:     Conjunctiva/sclera: Conjunctivae normal.  Cardiovascular:     Rate and Rhythm: Normal rate and regular rhythm.     Pulses: Normal pulses.     Heart sounds: Murmur heard.  Pulmonary:     Effort: Pulmonary effort is normal.     Breath sounds: Normal breath sounds.  Abdominal:  General: Bowel sounds are normal.     Palpations: Abdomen is soft.     Tenderness: There is no abdominal tenderness.  Musculoskeletal:     Cervical back: No tenderness.  Lymphadenopathy:     Cervical: No cervical adenopathy.  Skin:    General: Skin is warm and dry.     Capillary Refill: Capillary refill takes less than 2 seconds.  Neurological:     General: No focal deficit present.     Mental Status: He is alert and oriented to person, place, and time. Mental status is at baseline.  Psychiatric:        Mood and Affect: Mood normal.        Behavior: Behavior normal.        Thought Content: Thought content normal.        Judgment: Judgment normal.     Assessment & Plan:  Other fatigue Assessment & Plan: EKG NSR, will obtain CBC.  Orders: -     CBC with Differential/Platelet -     COMPLETE METABOLIC PANEL WITH GFR -     EKG 12-Lead  Urinary  frequency -     Urinalysis, Complete  Viral illness Assessment & Plan: I suspect he is most likely overcoming a viral illness. Will r/o anemia, EKG was normal, UA negative. Given his symptoms are overall improving I encouraged him to continue Tylenol PRN for body aches and push fluids. Return to office if symptoms persist or worsen.      Follow up plan: Return if symptoms worsen or fail to improve.  Park Meo, FNP

## 2023-02-20 NOTE — Addendum Note (Signed)
Addended by: Venia Carbon K on: 02/20/2023 02:19 PM   Modules accepted: Orders

## 2023-02-20 NOTE — Addendum Note (Signed)
Addended by: Park Meo on: 02/20/2023 12:31 PM   Modules accepted: Level of Service

## 2023-02-21 ENCOUNTER — Telehealth: Payer: Self-pay | Admitting: Family Medicine

## 2023-02-21 LAB — COMPLETE METABOLIC PANEL WITH GFR
AG Ratio: 1.4 (calc) (ref 1.0–2.5)
ALT: 17 U/L (ref 9–46)
AST: 19 U/L (ref 10–35)
Albumin: 4.4 g/dL (ref 3.6–5.1)
Alkaline phosphatase (APISO): 73 U/L (ref 35–144)
BUN: 19 mg/dL (ref 7–25)
CO2: 27 mmol/L (ref 20–32)
Calcium: 9.3 mg/dL (ref 8.6–10.3)
Chloride: 87 mmol/L — ABNORMAL LOW (ref 98–110)
Creat: 1.1 mg/dL (ref 0.70–1.22)
Globulin: 3.1 g/dL (calc) (ref 1.9–3.7)
Glucose, Bld: 94 mg/dL (ref 65–99)
Potassium: 3.3 mmol/L — ABNORMAL LOW (ref 3.5–5.3)
Sodium: 128 mmol/L — ABNORMAL LOW (ref 135–146)
Total Bilirubin: 2.9 mg/dL — ABNORMAL HIGH (ref 0.2–1.2)
Total Protein: 7.5 g/dL (ref 6.1–8.1)
eGFR: 66 mL/min/{1.73_m2} (ref >=60)

## 2023-02-21 LAB — CBC WITH DIFFERENTIAL/PLATELET
Absolute Monocytes: 1057 cells/uL — ABNORMAL HIGH (ref 200–950)
Basophils Absolute: 39 cells/uL (ref 0–200)
Eosinophils Absolute: 87 cells/uL (ref 15–500)
HCT: 43.9 % (ref 38.5–50.0)
Hemoglobin: 15.1 g/dL (ref 13.2–17.1)
Lymphs Abs: 1562 cells/uL (ref 850–3900)
MCH: 31.9 pg (ref 27.0–33.0)
MPV: 11.6 fL (ref 7.5–12.5)
Monocytes Relative: 10.9 %
Neutro Abs: 6955 cells/uL (ref 1500–7800)
RBC: 4.73 10*6/uL (ref 4.20–5.80)
RDW: 12.1 % (ref 11.0–15.0)
Total Lymphocyte: 16.1 %
WBC: 9.7 10*3/uL (ref 3.8–10.8)

## 2023-02-21 NOTE — Telephone Encounter (Signed)
Spoke with patient regarding his lab results. Instructed to hold his HCTZ and limit free water, continue to monitor BP at home and seek medical care if >180/120. Return to office for repeat labs on Monday.

## 2023-02-28 ENCOUNTER — Ambulatory Visit (INDEPENDENT_AMBULATORY_CARE_PROVIDER_SITE_OTHER): Payer: Medicare Other | Admitting: Family Medicine

## 2023-02-28 VITALS — BP 144/64 | HR 67 | Temp 97.9°F | Ht 67.0 in | Wt 199.2 lb

## 2023-02-28 DIAGNOSIS — I10 Essential (primary) hypertension: Secondary | ICD-10-CM | POA: Diagnosis not present

## 2023-02-28 NOTE — Progress Notes (Signed)
Subjective:    Patient ID: Christopher Bowen, male    DOB: July 28, 1935, 88 y.o.   MRN: [id number]  HPI Patient is a very pleasant 88 year old Caucasian gentleman who presents today for follow-up.  I saw him last in February.  At that time his blood pressure was elevated so I added losartan 50 mg a day to his amlodipine and his hydrochlorothiazide.  Recently saw my partner on June 5.  At that time he was extremely weak and experiencing bodyaches and fatigue.  His blood pressure was relatively low as well with a systolic blood pressure of 110.  She felt that he recently had a virus however his lab work showed hyponatremia with a sodium of 128, hypokalemia with potassium of 3.3 and low chloride levels as well.  At that point we discontinued hydrochlorothiazide and had the patient push Gatorade.  He is here today for follow-up.  He feels much better.  However he admits he been drinking Gatorade.  His systolic blood pressure is between 140 and 150 Past Medical History:  Diagnosis Date   Anemia    Diverticulosis    Former smoker    Hyperlipidemia    Hypertension     Past Surgical History:  Procedure Laterality Date   COLONOSCOPY Left 03/28/2014   Procedure: COLONOSCOPY;  Surgeon: Willis Modena, MD;  Location: [facility];  Service: Endoscopy;  Laterality: Left;   Current Outpatient Medications on File Prior to Visit  Medication Sig Dispense Refill   amLODipine (NORVASC) 10 MG tablet Take 1 tablet by mouth once daily 90 tablet 0   atorvastatin (LIPITOR) 20 MG tablet TAKE ONE TABLET BY MOUTH ONE TIME DAILY 90 tablet 0   hydrochlorothiazide (HYDRODIURIL) 25 MG tablet Take 1 tablet by mouth once daily 90 tablet 0   losartan (COZAAR) 50 MG tablet Take 1 tablet (50 mg total) by mouth daily. 90 tablet 3   No current facility-administered medications on file prior to visit.   Allergies  Allergen Reactions   Penicillins Anaphylaxis   Social History   Socioeconomic History   Marital status: Divorced     Spouse name: Not on file   Number of children: 6   Years of education: Not on file   Highest education level: Not on file  Occupational History   Not on file  Tobacco Use   Smoking status: Former    Packs/day: .5    Types: Cigarettes    Quit date: 09/17/1968    Years since quitting: 54.4   Smokeless tobacco: Never  Substance and Sexual Activity   Alcohol use: No   Drug use: No   Sexual activity: Not on file  Other Topics Concern   Not on file  Social History Narrative   Lives alone, and does not use assist device, no home health services. 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     Social Determinants of Health   Financial Resource Strain: Low Risk  (10/12/2022)   Overall Financial Resource Strain (CARDIA)    Difficulty of Paying Living Expenses: Not very hard  Food Insecurity: No Food Insecurity (10/12/2022)   Hunger Vital Sign    Worried About Running Out of Food in the Last Year: Never true    Ran Out of Food in the Last Year: Never true  Transportation Needs: Unknown (10/12/2022)   PRAPARE - Administrator, Civil Service (Medical): Not on file    Lack of Transportation (Non-Medical): No  Physical Activity: Inactive (10/12/2022)   Exercise  Vital Sign    Days of Exercise per Week: 0 days    Minutes of Exercise per Session: 0 min  Stress: No Stress Concern Present (10/12/2022)   [employer] of Occupational Health - Occupational Stress Questionnaire    Feeling of Stress : Not at all  Social Connections: Moderately Isolated (10/12/2022)   Social Connection and Isolation Panel [NHANES]    Frequency of Communication with Friends and Family: Not on file    Frequency of Social Gatherings with Friends and Family: Three times a week    Attends Religious Services: Never    Active Member of Clubs or Organizations: Yes    Attends Banker Meetings: More than 4 times per year    Marital Status: Divorced  Intimate Partner Violence: Not At Risk (10/12/2022)    Humiliation, Afraid, Rape, and Kick questionnaire    Fear of Current or Ex-Partner: No    Emotionally Abused: No    Physically Abused: No    Sexually Abused: No   Family History  Problem Relation Age of Onset   Colon cancer Neg Hx    Ulcerative colitis Neg Hx    Crohn's disease Neg Hx    Diabetes Mother    Cerebral aneurysm Mother    Heart disease Father        age 31   Diabetes Brother       Review of Systems  All other systems reviewed and are negative.      Objective:   Physical Exam Vitals reviewed.  Constitutional:      General: He is not in acute distress.    Appearance: Normal appearance. He is well-developed. He is obese. He is not diaphoretic.  HENT:     Head: Normocephalic and atraumatic.     Right Ear: External ear normal.     Left Ear: External ear normal.  Eyes:     General: No scleral icterus.       Right eye: No discharge.        Left eye: No discharge.     Conjunctiva/sclera: Conjunctivae normal.     Pupils: Pupils are equal, round, and reactive to light.  Neck:     Thyroid: No thyromegaly.     Vascular: No JVD.     Trachea: No tracheal deviation.  Cardiovascular:     Rate and Rhythm: Normal rate and regular rhythm.     Heart sounds: Murmur heard.     Systolic murmur is present with a grade of 3/6.     No friction rub. No gallop.  Pulmonary:     Effort: Pulmonary effort is normal. No respiratory distress.     Breath sounds: Normal breath sounds. No stridor. No wheezing, rhonchi or rales.  Chest:     Chest wall: No tenderness.  Abdominal:     Tenderness: There is no rebound.  Musculoskeletal:     Right lower leg: No edema.     Left lower leg: No edema.  Skin:    General: Skin is warm.     Coloration: Skin is not jaundiced or pale.     Findings: No bruising or erythema.  Neurological:     Mental Status: He is alert and oriented to person, place, and time.     Cranial Nerves: No cranial nerve deficit.     Sensory: No sensory deficit.      Motor: No weakness or abnormal muscle tone.     Coordination: Coordination normal.     Gait: Gait  normal.     Deep Tendon Reflexes: Reflexes are normal and symmetric. Reflexes normal.  Psychiatric:        Mood and Affect: Mood normal.        Behavior: Behavior normal.        Thought Content: Thought content normal.        Judgment: Judgment normal.       Assessment & Plan:  Benign essential HTN - Plan: BASIC METABOLIC PANEL WITH GFR I believe his symptoms are due to a combination of profound hyponatremia, viral illness, and hypotension.  All of this seems to have resolved now.  Stay off hydrochlorothiazide.  Recheck BMP today to ensure electrolytes have normalized.  Advised patient to discontinue Gatorade.  I am hoping I can get his blood pressure down just by having him reduce his sodium intake.  Recheck blood pressure in 2 weeks.  If persistently elevated will increase losartan 100 mg a day

## 2023-03-01 LAB — BASIC METABOLIC PANEL WITH GFR
BUN: 11 mg/dL (ref 7–25)
CO2: 28 mmol/L (ref 20–32)
Calcium: 8.7 mg/dL (ref 8.6–10.3)
Chloride: 97 mmol/L — ABNORMAL LOW (ref 98–110)
Creat: 0.8 mg/dL (ref 0.70–1.22)
Glucose, Bld: 150 mg/dL — ABNORMAL HIGH (ref 65–99)
Potassium: 3.7 mmol/L (ref 3.5–5.3)
Sodium: 135 mmol/L (ref 135–146)
eGFR: 87 mL/min/{1.73_m2} (ref >=60)

## 2023-05-12 ENCOUNTER — Other Ambulatory Visit: Payer: Self-pay | Admitting: Family Medicine

## 2023-05-14 NOTE — Telephone Encounter (Signed)
Requested medication (s) are due for refill today:yes  Requested medication (s) are on the active medication list: yes  Last refill:  01/15/23 #90  Future visit scheduled:no  Notes to clinic:  pt was to come back to check BP back in June do not see that visit   Requested Prescriptions  Pending Prescriptions Disp Refills   amLODipine (NORVASC) 10 MG tablet [Pharmacy Med Name: amLODIPine Besylate 10 MG Oral Tablet] 90 tablet 0    Sig: Take 1 tablet by mouth once daily     Cardiovascular: Calcium Channel Blockers 2 Failed - 05/12/2023 11:36 AM      Failed - Last BP in normal range    BP Readings from Last 1 Encounters:  02/28/23 (!) 144/64         Failed - Valid encounter within last 6 months    Recent Outpatient Visits           1 year ago Benign essential HTN   [facility] Donita Brooks, MD   2 years ago Benign essential HTN   [facility] Tanya Nones, Priscille Heidelberg, MD   3 years ago Benign essential HTN   [facility] Tanya Nones, Priscille Heidelberg, MD   4 years ago Visit for suture removal   [facility] Donita Brooks, MD   5 years ago Change in pigmented skin lesion of face   [facility] Donita Brooks, MD              Passed - Last Heart Rate in normal range    Pulse Readings from Last 1 Encounters:  02/28/23 67         Refused Prescriptions Disp Refills   hydrochlorothiazide (HYDRODIURIL) 25 MG tablet [Pharmacy Med Name: hydroCHLOROthiazide 25 MG Oral Tablet] 90 tablet     Sig: Take 1 tablet by mouth once daily     Cardiovascular: Diuretics - Thiazide Failed - 05/12/2023 11:36 AM      Failed - Last BP in normal range    BP Readings from Last 1 Encounters:  02/28/23 (!) 144/64         Failed - Valid encounter within last 6 months    Recent Outpatient Visits           1 year ago Benign essential HTN   [facility] Donita Brooks, MD   2 years ago Benign essential  HTN   [facility] Tanya Nones, Priscille Heidelberg, MD   3 years ago Benign essential HTN   [facility] Tanya Nones, Priscille Heidelberg, MD   4 years ago Visit for suture removal   [facility] Donita Brooks, MD   5 years ago Change in pigmented skin lesion of face   [facility] Pickard, Priscille Heidelberg, MD              Passed - Cr in normal range and within 180 days    Creat  Date Value Ref Range Status  02/28/2023 0.80 0.70 - 1.22 mg/dL Final         Passed - K in normal range and within 180 days    Potassium  Date Value Ref Range Status  02/28/2023 3.7 3.5 - 5.3 mmol/L Final         Passed - Na in normal range and within 180 days    Sodium  Date Value Ref Range Status  02/28/2023 135 135 -  146 mmol/L Final

## 2023-05-14 NOTE — Telephone Encounter (Signed)
Requested Prescriptions  Pending Prescriptions Disp Refills   amLODipine (NORVASC) 10 MG tablet [Pharmacy Med Name: amLODIPine Besylate 10 MG Oral Tablet] 90 tablet 0    Sig: Take 1 tablet by mouth once daily     Cardiovascular: Calcium Channel Blockers 2 Failed - 05/12/2023 11:36 AM      Failed - Last BP in normal range    BP Readings from Last 1 Encounters:  02/28/23 (!) 144/64         Failed - Valid encounter within last 6 months    Recent Outpatient Visits           1 year ago Benign essential HTN   [facility] Donita Brooks, MD   2 years ago Benign essential HTN   [facility] Donita Brooks, MD   3 years ago Benign essential HTN   [facility] Tanya Nones, Priscille Heidelberg, MD   4 years ago Visit for suture removal   [facility] Donita Brooks, MD   5 years ago Change in pigmented skin lesion of face   [facility] Donita Brooks, MD              Passed - Last Heart Rate in normal range    Pulse Readings from Last 1 Encounters:  02/28/23 67         Refused Prescriptions Disp Refills   hydrochlorothiazide (HYDRODIURIL) 25 MG tablet [Pharmacy Med Name: hydroCHLOROthiazide 25 MG Oral Tablet] 90 tablet     Sig: Take 1 tablet by mouth once daily     Cardiovascular: Diuretics - Thiazide Failed - 05/12/2023 11:36 AM      Failed - Last BP in normal range    BP Readings from Last 1 Encounters:  02/28/23 (!) 144/64         Failed - Valid encounter within last 6 months    Recent Outpatient Visits           1 year ago Benign essential HTN   [facility] Donita Brooks, MD   2 years ago Benign essential HTN   [facility] Tanya Nones, Priscille Heidelberg, MD   3 years ago Benign essential HTN   [facility] Tanya Nones, Priscille Heidelberg, MD   4 years ago Visit for suture removal   [facility] Donita Brooks, MD   5 years ago Change in  pigmented skin lesion of face   [facility] Pickard, Priscille Heidelberg, MD              Passed - Cr in normal range and within 180 days    Creat  Date Value Ref Range Status  02/28/2023 0.80 0.70 - 1.22 mg/dL Final         Passed - K in normal range and within 180 days    Potassium  Date Value Ref Range Status  02/28/2023 3.7 3.5 - 5.3 mmol/L Final         Passed - Na in normal range and within 180 days    Sodium  Date Value Ref Range Status  02/28/2023 135 135 - 146 mmol/L Final

## 2023-05-18 ENCOUNTER — Other Ambulatory Visit: Payer: Self-pay | Admitting: Family Medicine

## 2023-05-21 NOTE — Telephone Encounter (Signed)
Requested by interface surescripts. Last OV 02/28/23.  Requested Prescriptions  Pending Prescriptions Disp Refills   atorvastatin (LIPITOR) 20 MG tablet [Pharmacy Med Name: Atorvastatin Calcium Oral Tablet 20 MG] 90 tablet 0    Sig: TAKE ONE TABLET BY MOUTH ONE TIME DAILY     Cardiovascular:  Antilipid - Statins Failed - 05/21/2023  3:08 PM      Failed - Valid encounter within last 12 months    Recent Outpatient Visits           1 year ago Benign essential HTN   [facility] Donita Brooks, MD   2 years ago Benign essential HTN   [facility] Donita Brooks, MD   3 years ago Benign essential HTN   [facility] Tanya Nones, Priscille Heidelberg, MD   5 years ago Visit for suture removal   [facility] Donita Brooks, MD   5 years ago Change in pigmented skin lesion of face   [facility] Donita Brooks, MD              Failed - Lipid Panel in normal range within the last 12 months    Cholesterol  Date Value Ref Range Status  10/22/2022 152 <200 mg/dL Final   LDL Cholesterol (Calc)  Date Value Ref Range Status  10/22/2022 79 mg/dL (calc) Final    Comment:    Reference range: <100 . Desirable range <100 mg/dL for primary prevention;   <70 mg/dL for patients with CHD or diabetic patients  with > or = 2 CHD risk factors. Marland Kitchen LDL-C is now calculated using the Martin-Hopkins  calculation, which is a validated novel method providing  better accuracy than the Friedewald equation in the  estimation of LDL-C.  Horald Pollen et al. Lenox Ahr. 1610;960(45): 2061-2068  (http://education.[facility].com/faq/FAQ164)    Direct LDL  Date Value Ref Range Status  08/18/2019 138 (H) <100 mg/dL Final    Comment:    Greatly elevated Triglycerides values (>1200 mg/dL) interfere with the dLDL assay. As no Triglycerides  testing was ordered, interpret results with caution. . Desirable range <100 mg/dL for primary  prevention;   <70 mg/dL for patients with CHD or diabetic patients  with > or = 2 CHD risk factors. Marland Kitchen    HDL  Date Value Ref Range Status  10/22/2022 58 > OR = 40 mg/dL Final   Triglycerides  Date Value Ref Range Status  10/22/2022 71 <150 mg/dL Final         Passed - Patient is not pregnant

## 2023-08-15 ENCOUNTER — Other Ambulatory Visit: Payer: Self-pay | Admitting: Family Medicine

## 2023-08-19 NOTE — Telephone Encounter (Signed)
Requested Prescriptions  Pending Prescriptions Disp Refills   atorvastatin (LIPITOR) 20 MG tablet [Pharmacy Med Name: Atorvastatin Calcium Oral Tablet 20 MG] 90 tablet 0    Sig: TAKE ONE TABLET BY MOUTH ONCE A DAY     Cardiovascular:  Antilipid - Statins Failed - 08/15/2023  9:06 AM      Failed - Valid encounter within last 12 months    Recent Outpatient Visits           1 year ago Benign essential HTN   [facility] Donita Brooks, MD   2 years ago Benign essential HTN   [facility] Tanya Nones, Priscille Heidelberg, MD   4 years ago Benign essential HTN   [facility] Tanya Nones, Priscille Heidelberg, MD   5 years ago Visit for suture removal   [facility] Donita Brooks, MD   5 years ago Change in pigmented skin lesion of face   [facility] Donita Brooks, MD              Failed - Lipid Panel in normal range within the last 12 months    Cholesterol  Date Value Ref Range Status  10/22/2022 152 <200 mg/dL Final   LDL Cholesterol (Calc)  Date Value Ref Range Status  10/22/2022 79 mg/dL (calc) Final    Comment:    Reference range: <100 . Desirable range <100 mg/dL for primary prevention;   <70 mg/dL for patients with CHD or diabetic patients  with > or = 2 CHD risk factors. Marland Kitchen LDL-C is now calculated using the Martin-Hopkins  calculation, which is a validated novel method providing  better accuracy than the Friedewald equation in the  estimation of LDL-C.  Horald Pollen et al. Lenox Ahr. 1610;960(45): 2061-2068  (http://education.[facility].com/faq/FAQ164)    Direct LDL  Date Value Ref Range Status  08/18/2019 138 (H) <100 mg/dL Final    Comment:    Greatly elevated Triglycerides values (>1200 mg/dL) interfere with the dLDL assay. As no Triglycerides  testing was ordered, interpret results with caution. . Desirable range <100 mg/dL for primary prevention;   <70 mg/dL for patients with CHD or diabetic  patients  with > or = 2 CHD risk factors. Marland Kitchen    HDL  Date Value Ref Range Status  10/22/2022 58 > OR = 40 mg/dL Final   Triglycerides  Date Value Ref Range Status  10/22/2022 71 <150 mg/dL Final         Passed - Patient is not pregnant

## 2023-10-18 ENCOUNTER — Ambulatory Visit: Payer: Self-pay | Admitting: Family Medicine

## 2023-10-18 NOTE — Telephone Encounter (Signed)
  Chief Complaint: high blood pressure readings Symptoms: asymptomatic SBPs 150-160 Frequency: noticed yesterday afternoon Pertinent Negatives: Patient denies CP, SOB, headache Disposition: [] ED /[] Urgent Care (no appt availability in office) / [x] Appointment(In office/virtual)/ []  [place] [facility]/ [] Home Care/ [] Refused Recommended Disposition /[] [place] [facility]/ []  Follow-up with PCP Additional Notes: three attempts made with patient's phone number. Attempted Daughter's number who was listed in the note to call if patient didn't answer phone. patient's daughter states patient has been having high blood pressure readings since yesterday afternoon. Asymptomatic with SBPs 150-160. Patient and daughter are concerned. Education given for symptoms to watch for with blood pressure readings. Per protocol, the recommendation is appointment. Appointment made for 10/21/2023 at 8:00 am at PCP. Patient's daughter verbalized understanding and all questions answered.     Reason for Disposition  [1] Systolic BP  >= 130 OR Diastolic >= 80 AND [2] taking BP medications  Answer Assessment - Initial Assessment Questions 1. BLOOD PRESSURE: "What is the blood pressure?" "Did you take at least two measurements 5 minutes apart?"     SBP 150s-160s 2. ONSET: "When did you take your blood pressure?"     yesterday 3. HOW: "How did you take your blood pressure?" (e.g., automatic home BP monitor, visiting nurse)     Automatic home BP monitor 4. HISTORY: "Do you have a history of high blood pressure?"     yes 5. MEDICINES: "Are you taking any medicines for blood pressure?" "Have you missed any doses recently?"     Amlodipine and losartan-no doses missed 6. OTHER SYMPTOMS: "Do you have any symptoms?" (e.g., blurred vision, chest pain, difficulty breathing, headache, weakness)     No  Protocols used: Blood Pressure - High-A-AH

## 2023-10-18 NOTE — Telephone Encounter (Signed)
  Additional Notes: This triage RN attempted to call the patient at this time, no answer, unable to leave voicemail.  Attempt 1/1

## 2023-10-21 ENCOUNTER — Ambulatory Visit (INDEPENDENT_AMBULATORY_CARE_PROVIDER_SITE_OTHER): Payer: Medicare Other | Admitting: Family Medicine

## 2023-10-21 ENCOUNTER — Encounter: Payer: Self-pay | Admitting: Family Medicine

## 2023-10-21 VITALS — BP 140/80 | HR 63 | Temp 98.2°F | Ht 67.0 in | Wt 204.0 lb

## 2023-10-21 DIAGNOSIS — I10 Essential (primary) hypertension: Secondary | ICD-10-CM | POA: Insufficient documentation

## 2023-10-21 MED ORDER — LOSARTAN POTASSIUM 100 MG PO TABS: 100.0000 mg | ORAL_TABLET | Freq: Every day | ORAL | 1 refills | Status: DC

## 2023-10-21 NOTE — Progress Notes (Signed)
Subjective:  HPI: Christopher Bowen is a 89 y.o. male presenting on 10/21/2023 for No chief complaint on file.   HPI Patient is in today for BP elevated to 150-165/70-80 for a "couple weeks" now. He does check his BP every day in AM and/or PM. The lowest he reads is 120/70-80s. Christopher Bowen was previously on hydrochlorothiazide and this was discontinued 7 months ago. He denies any dietary changes or high sodium in his diet. He denies chest pain, palpitations, recurrent headaches, vision changes, lightheadedness, dizziness, dyspnea on exertion, or swelling of extremities. The previous plan with my partner was to increase Losartan to 100mg  daily if BP persistently elevated.   Review of Systems  All other systems reviewed and are negative.   Relevant past medical history reviewed and updated as indicated.   Past Medical History:  Diagnosis Date   Anemia    Diverticulosis    Former smoker    Hyperlipidemia    Hypertension      Past Surgical History:  Procedure Laterality Date   COLONOSCOPY Left 03/28/2014   Procedure: COLONOSCOPY;  Surgeon: Willis Modena, MD;  Location: [facility];  Service: Endoscopy;  Laterality: Left;    Allergies and medications reviewed and updated.   Current Outpatient Medications:    amLODipine (NORVASC) 10 MG tablet, Take 1 tablet by mouth once daily, Disp: 90 tablet, Rfl: 1   atorvastatin (LIPITOR) 20 MG tablet, TAKE ONE TABLET BY MOUTH ONCE A DAY, Disp: 90 tablet, Rfl: 0   losartan (COZAAR) 100 MG tablet, Take 1 tablet (100 mg total) by mouth daily., Disp: 90 tablet, Rfl: 1  Allergies  Allergen Reactions   Penicillins Anaphylaxis    Objective:   BP (!) 140/80   Pulse 63   Temp 98.2 F (36.8 C) (Oral)   Ht 5\' 7"  (1.702 m)   Wt 204 lb (92.5 kg)   SpO2 96%   BMI 31.95 kg/m      10/21/2023    8:01 AM 02/28/2023    9:46 AM 02/20/2023   11:42 AM  Vitals with BMI  Height 5\' 7"  5\' 7"  5\' 7"   Weight 204 lbs 199 lbs 3 oz 199 lbs 6 oz  BMI 31.94 31.19  31.22  Systolic 140 144 119  Diastolic 80 64 62  Pulse 63 67 75     Physical Exam Vitals and nursing note reviewed.  Constitutional:      Appearance: Normal appearance. He is normal weight.  HENT:     Head: Normocephalic and atraumatic.  Cardiovascular:     Rate and Rhythm: Normal rate and regular rhythm.     Pulses: Normal pulses.     Heart sounds: Murmur heard.  Pulmonary:     Effort: Pulmonary effort is normal.     Breath sounds: Normal breath sounds.  Musculoskeletal:     Right lower leg: No edema.     Left lower leg: No edema.  Skin:    General: Skin is warm and dry.     Capillary Refill: Capillary refill takes less than 2 seconds.  Neurological:     General: No focal deficit present.     Mental Status: He is alert and oriented to person, place, and time. Mental status is at baseline.  Psychiatric:        Mood and Affect: Mood normal.        Behavior: Behavior normal.        Thought Content: Thought content normal.        Judgment:  Judgment normal.     Assessment & Plan:  Benign essential HTN Assessment & Plan: Uncontrolled on Amlodipine 10mg  daily and Losartan 50mg  daily. Will increase Losartan to 100mg  daily and asked to write down his daily readings and return to PCP in 1-2 weeks or sooner if BP <110/60 or >180/110 or seek medical care immediately for chest pain, palpitations, recurrent headaches, vision changes, lightheadedness, dizziness, dyspnea on exertion, or swelling of extremities. Counseled on importance of heart healthy diet.    Other orders -     Losartan Potassium; Take 1 tablet (100 mg total) by mouth daily.  Dispense: 90 tablet; Refill: 1     Follow up plan: Return in about 2 weeks (around 11/04/2023) for with PCP hypertension follow-up.  Park Meo, FNP

## 2023-10-21 NOTE — Assessment & Plan Note (Signed)
Uncontrolled on Amlodipine 10mg  daily and Losartan 50mg  daily. Will increase Losartan to 100mg  daily and asked to write down his daily readings and return to PCP in 1-2 weeks or sooner if BP <110/60 or >180/110 or seek medical care immediately for chest pain, palpitations, recurrent headaches, vision changes, lightheadedness, dizziness, dyspnea on exertion, or swelling of extremities. Counseled on importance of heart healthy diet.

## 2023-11-04 ENCOUNTER — Ambulatory Visit (INDEPENDENT_AMBULATORY_CARE_PROVIDER_SITE_OTHER): Payer: Medicare Other | Admitting: Family Medicine

## 2023-11-04 VITALS — BP 140/68 | HR 70 | Temp 98.4°F | Resp 16 | Wt 203.0 lb

## 2023-11-04 DIAGNOSIS — E7800 Pure hypercholesterolemia, unspecified: Secondary | ICD-10-CM | POA: Diagnosis not present

## 2023-11-04 DIAGNOSIS — I10 Essential (primary) hypertension: Secondary | ICD-10-CM

## 2023-11-04 NOTE — Progress Notes (Signed)
Subjective:    Patient ID: Christopher Bowen, male    DOB: 15-Feb-1935, 89 y.o.   MRN: [id number]  HPI Patient is a very pleasant 89 year old Caucasian gentleman who presents today for follow-up.  He saw my partner 2 weeks ago due to elevated blood pressure.  His systolic blood pressure was 160.  At that point, they increase losartan to 100 mg a day.  Since that time, his blood pressure has trended down nicely.  Over the last 7 days his blood pressure has been in the 130-140 range consistently.  He denies any chest pain shortness of breath or dyspnea on exertion.  He denies any leg swelling.  He denies any orthopnea.  He denies any abdominal pain or nausea or vomiting.  He is due today for fasting lab work. Past Medical History:  Diagnosis Date   Anemia    Diverticulosis    Former smoker    Hyperlipidemia    Hypertension     Past Surgical History:  Procedure Laterality Date   COLONOSCOPY Left 03/28/2014   Procedure: COLONOSCOPY;  Surgeon: Willis Modena, MD;  Location: [facility];  Service: Endoscopy;  Laterality: Left;   Current Outpatient Medications on File Prior to Visit  Medication Sig Dispense Refill   amLODipine (NORVASC) 10 MG tablet Take 1 tablet by mouth once daily 90 tablet 1   atorvastatin (LIPITOR) 20 MG tablet TAKE ONE TABLET BY MOUTH ONCE A DAY 90 tablet 0   losartan (COZAAR) 100 MG tablet Take 1 tablet (100 mg total) by mouth daily. 90 tablet 1   No current facility-administered medications on file prior to visit.   Allergies  Allergen Reactions   Penicillins Anaphylaxis   Social History   Socioeconomic History   Marital status: Divorced    Spouse name: Not on file   Number of children: 6   Years of education: Not on file   Highest education level: Not on file  Occupational History   Not on file  Tobacco Use   Smoking status: Former    Current packs/day: 0.00    Types: Cigarettes    Quit date: 09/17/1968    Years since quitting: 55.1   Smokeless tobacco:  Never  Substance and Sexual Activity   Alcohol use: No   Drug use: No   Sexual activity: Not on file  Other Topics Concern   Not on file  Social History Narrative   Lives alone, and does not use assist device, no home health services. 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     Social Drivers of Corporate investment banker Strain: Low Risk  (10/12/2022)   Overall Financial Resource Strain (CARDIA)    Difficulty of Paying Living Expenses: Not very hard  Food Insecurity: No Food Insecurity (10/12/2022)   Hunger Vital Sign    Worried About Running Out of Food in the Last Year: Never true    Ran Out of Food in the Last Year: Never true  Transportation Needs: Unknown (10/12/2022)   PRAPARE - Administrator, Civil Service (Medical): Not on file    Lack of Transportation (Non-Medical): No  Physical Activity: Inactive (10/12/2022)   Exercise Vital Sign    Days of Exercise per Week: 0 days    Minutes of Exercise per Session: 0 min  Stress: No Stress Concern Present (10/12/2022)   [employer] of Occupational Health - Occupational Stress Questionnaire    Feeling of Stress : Not at all  Social Connections: Moderately Isolated (  10/12/2022)   Social Connection and Isolation Panel [NHANES]    Frequency of Communication with Friends and Family: Not on file    Frequency of Social Gatherings with Friends and Family: Three times a week    Attends Religious Services: Never    Active Member of Clubs or Organizations: Yes    Attends Banker Meetings: More than 4 times per year    Marital Status: Divorced  Intimate Partner Violence: Not At Risk (10/12/2022)   Humiliation, Afraid, Rape, and Kick questionnaire    Fear of Current or Ex-Partner: No    Emotionally Abused: No    Physically Abused: No    Sexually Abused: No   Family History  Problem Relation Age of Onset   Colon cancer Neg Hx    Ulcerative colitis Neg Hx    Crohn's disease Neg Hx    Diabetes Mother    Cerebral  aneurysm Mother    Heart disease Father        age 75   Diabetes Brother       Review of Systems  All other systems reviewed and are negative.      Objective:   Physical Exam Vitals reviewed.  Constitutional:      General: He is not in acute distress.    Appearance: Normal appearance. He is well-developed. He is obese. He is not diaphoretic.  HENT:     Head: Normocephalic and atraumatic.     Right Ear: External ear normal.     Left Ear: External ear normal.  Eyes:     General: No scleral icterus.       Right eye: No discharge.        Left eye: No discharge.     Conjunctiva/sclera: Conjunctivae normal.     Pupils: Pupils are equal, round, and reactive to light.  Neck:     Thyroid: No thyromegaly.     Vascular: No JVD.     Trachea: No tracheal deviation.  Cardiovascular:     Rate and Rhythm: Normal rate and regular rhythm.     Heart sounds: Murmur heard.     Systolic murmur is present with a grade of 3/6.     No friction rub. No gallop.  Pulmonary:     Effort: Pulmonary effort is normal. No respiratory distress.     Breath sounds: Normal breath sounds. No stridor. No wheezing, rhonchi or rales.  Chest:     Chest wall: No tenderness.  Abdominal:     Tenderness: There is no rebound.  Musculoskeletal:     Right lower leg: No edema.     Left lower leg: No edema.  Skin:    General: Skin is warm.     Coloration: Skin is not jaundiced or pale.     Findings: No bruising or erythema.  Neurological:     Mental Status: He is alert and oriented to person, place, and time.     Cranial Nerves: No cranial nerve deficit.     Sensory: No sensory deficit.     Motor: No weakness or abnormal muscle tone.     Coordination: Coordination normal.     Gait: Gait normal.     Deep Tendon Reflexes: Reflexes are normal and symmetric. Reflexes normal.  Psychiatric:        Mood and Affect: Mood normal.        Behavior: Behavior normal.        Thought Content: Thought content normal.  Judgment: Judgment normal.       Assessment & Plan:  Benign essential HTN - Plan: CBC with Differential/Platelet, COMPLETE METABOLIC PANEL WITH GFR, Lipid panel  Pure hypercholesterolemia - Plan: CBC with Differential/Platelet, COMPLETE METABOLIC PANEL WITH GFR, Lipid panel Over the last week, his blood pressure has been excellent.  I have asked him to keep track of that.  If his systolic blood pressure is less than or equal to 140, I am very happy with that given his age.  I would not make additional changes.  If his blood pressure consistently rises above 140, I would next add Bystolic.  He had hyponatremia from hydrochlorothiazide last summer.  While the patient is here today I will check a CBC a CMP and a lipid panel.  Ideally I like to see his LDL cholesterol less than 956.

## 2023-11-05 LAB — LIPID PANEL
Cholesterol: 123 mg/dL (ref <200)
HDL: 56 mg/dL (ref >=40)
LDL Cholesterol (Calc): 54 mg/dL
Non-HDL Cholesterol (Calc): 67 mg/dL (ref <130)
Total CHOL/HDL Ratio: 2.2 (calc) (ref <5.0)
Triglycerides: 52 mg/dL (ref <150)

## 2023-11-05 LAB — COMPLETE METABOLIC PANEL WITH GFR
AG Ratio: 1.5 (calc) (ref 1.0–2.5)
ALT: 18 U/L (ref 9–46)
AST: 16 U/L (ref 10–35)
Albumin: 4.2 g/dL (ref 3.6–5.1)
Alkaline phosphatase (APISO): 78 U/L (ref 35–144)
BUN: 11 mg/dL (ref 7–25)
CO2: 28 mmol/L (ref 20–32)
Calcium: 8.9 mg/dL (ref 8.6–10.3)
Chloride: 101 mmol/L (ref 98–110)
Creat: 1.02 mg/dL (ref 0.70–1.22)
Globulin: 2.8 g/dL (ref 1.9–3.7)
Glucose, Bld: 105 mg/dL — ABNORMAL HIGH (ref 65–99)
Potassium: 4.8 mmol/L (ref 3.5–5.3)
Sodium: 136 mmol/L (ref 135–146)
Total Bilirubin: 0.9 mg/dL (ref 0.2–1.2)
Total Protein: 7 g/dL (ref 6.1–8.1)
eGFR: 72 mL/min/{1.73_m2} (ref >=60)

## 2023-11-05 LAB — CBC WITH DIFFERENTIAL/PLATELET
Absolute Lymphocytes: 1744 {cells}/uL (ref 850–3900)
Absolute Monocytes: 680 {cells}/uL (ref 200–950)
Basophils Absolute: 16 {cells}/uL (ref 0–200)
Basophils Relative: 0.2 %
Eosinophils Absolute: 128 {cells}/uL (ref 15–500)
Eosinophils Relative: 1.6 %
HCT: 41 % (ref 38.5–50.0)
Hemoglobin: 13.7 g/dL (ref 13.2–17.1)
MCH: 31.9 pg (ref 27.0–33.0)
MCHC: 33.4 g/dL (ref 32.0–36.0)
MCV: 95.6 fL (ref 80.0–100.0)
MPV: 12.4 fL (ref 7.5–12.5)
Monocytes Relative: 8.5 %
Neutro Abs: 5432 {cells}/uL (ref 1500–7800)
Neutrophils Relative %: 67.9 %
Platelets: 228 10*3/uL (ref 140–400)
RBC: 4.29 10*6/uL (ref 4.20–5.80)
RDW: 12.1 % (ref 11.0–15.0)
Total Lymphocyte: 21.8 %
WBC: 8 10*3/uL (ref 3.8–10.8)

## 2023-11-17 ENCOUNTER — Other Ambulatory Visit: Payer: Self-pay | Admitting: Family Medicine

## 2023-11-23 ENCOUNTER — Other Ambulatory Visit: Payer: Self-pay | Admitting: Family Medicine

## 2023-11-25 NOTE — Telephone Encounter (Signed)
 Last OV 11/04/23,within protocol.  Requested Prescriptions  Pending Prescriptions Disp Refills   atorvastatin (LIPITOR) 20 MG tablet [Pharmacy Med Name: Atorvastatin Calcium Oral Tablet 20 MG] 90 tablet 0    Sig: TAKE ONE TABLET BY MOUTH ONCE A DAY     Cardiovascular:  Antilipid - Statins Failed - 11/25/2023  3:57 PM      Failed - Valid encounter within last 12 months    Recent Outpatient Visits           2 years ago Benign essential HTN   [facility] Donita Brooks, MD   3 years ago Benign essential HTN   [facility] Tanya Nones, Priscille Heidelberg, MD   4 years ago Benign essential HTN   [facility] Tanya Nones, Priscille Heidelberg, MD   5 years ago Visit for suture removal   [facility] Donita Brooks, MD   5 years ago Change in pigmented skin lesion of face   [facility] Donita Brooks, MD              Failed - Lipid Panel in normal range within the last 12 months    Cholesterol  Date Value Ref Range Status  11/04/2023 123 <200 mg/dL Final   LDL Cholesterol (Calc)  Date Value Ref Range Status  11/04/2023 54 mg/dL (calc) Final    Comment:    Reference range: <100 . Desirable range <100 mg/dL for primary prevention;   <70 mg/dL for patients with CHD or diabetic patients  with > or = 2 CHD risk factors. Marland Kitchen LDL-C is now calculated using the Martin-Hopkins  calculation, which is a validated novel method providing  better accuracy than the Friedewald equation in the  estimation of LDL-C.  Horald Pollen et al. Lenox Ahr. 1610;960(45): 2061-2068  (http://education.[facility].com/faq/FAQ164)    Direct LDL  Date Value Ref Range Status  08/18/2019 138 (H) <100 mg/dL Final    Comment:    Greatly elevated Triglycerides values (>1200 mg/dL) interfere with the dLDL assay. As no Triglycerides  testing was ordered, interpret results with caution. . Desirable range <100 mg/dL for primary prevention;   <70 mg/dL  for patients with CHD or diabetic patients  with > or = 2 CHD risk factors. Marland Kitchen    HDL  Date Value Ref Range Status  11/04/2023 56 > OR = 40 mg/dL Final   Triglycerides  Date Value Ref Range Status  11/04/2023 52 <150 mg/dL Final         Passed - Patient is not pregnant

## 2023-12-14 ENCOUNTER — Other Ambulatory Visit: Payer: Self-pay | Admitting: Family Medicine

## 2023-12-17 NOTE — Telephone Encounter (Signed)
 Duplicate request,last refill 10/23/23 for 90 days.  Requested Prescriptions  Pending Prescriptions Disp Refills   losartan (COZAAR) 50 MG tablet [Pharmacy Med Name: Losartan Potassium 50 MG Oral Tablet] 90 tablet 0    Sig: Take 1 tablet by mouth once daily     Cardiovascular:  Angiotensin Receptor Blockers Failed - 12/17/2023  8:07 AM      Failed - Last BP in normal range    BP Readings from Last 1 Encounters:  11/04/23 (!) 140/68         Failed - Valid encounter within last 6 months    Recent Outpatient Visits           1 month ago Benign essential HTN   [place] [facility] Donita Brooks, MD   1 month ago Benign essential HTN   [place] [facility] Park Meo, FNP   9 months ago Benign essential HTN   [place] [facility] Tanya Nones, Priscille Heidelberg, MD   10 months ago Other fatigue   [place] [facility] Park Meo, FNP   1 year ago Benign essential HTN   [place] [facility] Pickard, Priscille Heidelberg, MD              Passed - Cr in normal range and within 180 days    Creat  Date Value Ref Range Status  11/04/2023 1.02 0.70 - 1.22 mg/dL Final         Passed - K in normal range and within 180 days    Potassium  Date Value Ref Range Status  11/04/2023 4.8 3.5 - 5.3 mmol/L Final         Passed - Patient is not pregnant

## 2023-12-27 ENCOUNTER — Encounter: Payer: Self-pay | Admitting: Family Medicine

## 2023-12-27 ENCOUNTER — Ambulatory Visit (INDEPENDENT_AMBULATORY_CARE_PROVIDER_SITE_OTHER): Admitting: Family Medicine

## 2023-12-27 VITALS — BP 130/64 | HR 71 | Temp 98.6°F | Ht 67.0 in | Wt 200.4 lb

## 2023-12-27 DIAGNOSIS — I10 Essential (primary) hypertension: Secondary | ICD-10-CM | POA: Diagnosis not present

## 2023-12-27 MED ORDER — LOSARTAN POTASSIUM-HCTZ 100-12.5 MG PO TABS: 1.0000 | ORAL_TABLET | Freq: Every day | ORAL | 3 refills | Status: DC

## 2023-12-27 NOTE — Progress Notes (Signed)
 Subjective:    Patient ID: Christopher Bowen, male    DOB: [birth year], 89 y.o.   MRN: [id number]  HPI BP at home has been 150-160/80's.  Denies chest pain or sob or doe.  Also has a swollen inflamed sebeceous cyst on left flank for 1 week, now draining foul smelling material.  Has spontaneously ruptured.  2 cm in diameter.   Past Medical History:  Diagnosis Date   Anemia    Diverticulosis    Former smoker    Hyperlipidemia    Hypertension     Past Surgical History:  Procedure Laterality Date   COLONOSCOPY Left 03/28/2014   Procedure: COLONOSCOPY;  Surgeon: Willis Modena, MD;  Location: [facility];  Service: Endoscopy;  Laterality: Left;   Current Outpatient Medications on File Prior to Visit  Medication Sig Dispense Refill   amLODipine (NORVASC) 10 MG tablet Take 1 tablet by mouth once daily 90 tablet 0   atorvastatin (LIPITOR) 20 MG tablet TAKE ONE TABLET BY MOUTH ONCE A DAY 90 tablet 0   losartan (COZAAR) 100 MG tablet Take 1 tablet (100 mg total) by mouth daily. 90 tablet 1   No current facility-administered medications on file prior to visit.   Allergies  Allergen Reactions   Penicillins Anaphylaxis   Social History   Socioeconomic History   Marital status: Divorced    Spouse name: Not on file   Number of children: 6   Years of education: Not on file   Highest education level: Not on file  Occupational History   Not on file  Tobacco Use   Smoking status: Former    Current packs/day: 0.00    Types: Cigarettes    Quit date: 09/17/1968    Years since quitting: 55.3   Smokeless tobacco: Never  Substance and Sexual Activity   Alcohol use: No   Drug use: No   Sexual activity: Not on file  Other Topics Concern   Not on file  Social History Narrative   Lives alone, and does not use assist device, no home health services. 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     Social Drivers of Corporate investment banker Strain: Low Risk  (10/12/2022)   Overall Financial Resource  Strain (CARDIA)    Difficulty of Paying Living Expenses: Not very hard  Food Insecurity: No Food Insecurity (10/12/2022)   Hunger Vital Sign    Worried About Running Out of Food in the Last Year: Never true    Ran Out of Food in the Last Year: Never true  Transportation Needs: Unknown (10/12/2022)   PRAPARE - Administrator, Civil Service (Medical): Not on file    Lack of Transportation (Non-Medical): No  Physical Activity: Inactive (10/12/2022)   Exercise Vital Sign    Days of Exercise per Week: 0 days    Minutes of Exercise per Session: 0 min  Stress: No Stress Concern Present (10/12/2022)   [employer] of Occupational Health - Occupational Stress Questionnaire    Feeling of Stress : Not at all  Social Connections: Moderately Isolated (10/12/2022)   Social Connection and Isolation Panel [NHANES]    Frequency of Communication with Friends and Family: Not on file    Frequency of Social Gatherings with Friends and Family: Three times a week    Attends Religious Services: Never    Active Member of Clubs or Organizations: Yes    Attends Banker Meetings: More than 4 times per year  Marital Status: Divorced  Catering manager Violence: Not At Risk (10/12/2022)   Humiliation, Afraid, Rape, and Kick questionnaire    Fear of Current or Ex-Partner: No    Emotionally Abused: No    Physically Abused: No    Sexually Abused: No   Family History  Problem Relation Age of Onset   Colon cancer Neg Hx    Ulcerative colitis Neg Hx    Crohn's disease Neg Hx    Diabetes Mother    Cerebral aneurysm Mother    Heart disease Father        age 72   Diabetes Brother       Review of Systems  All other systems reviewed and are negative.      Objective:   Physical Exam Vitals reviewed.  Constitutional:      General: He is not in acute distress.    Appearance: Normal appearance. He is well-developed. He is obese. He is not diaphoretic.  HENT:     Head:  Normocephalic and atraumatic.     Right Ear: External ear normal.     Left Ear: External ear normal.  Eyes:     General: No scleral icterus.       Right eye: No discharge.        Left eye: No discharge.     Conjunctiva/sclera: Conjunctivae normal.     Pupils: Pupils are equal, round, and reactive to light.  Neck:     Thyroid: No thyromegaly.     Vascular: No JVD.     Trachea: No tracheal deviation.  Cardiovascular:     Rate and Rhythm: Normal rate and regular rhythm.     Heart sounds: Murmur heard.     Systolic murmur is present with a grade of 3/6.     No friction rub. No gallop.  Pulmonary:     Effort: Pulmonary effort is normal. No respiratory distress.     Breath sounds: Normal breath sounds. No stridor. No wheezing, rhonchi or rales.  Chest:     Chest wall: No tenderness.  Abdominal:     Tenderness: There is no rebound.  Musculoskeletal:       Back:     Right lower leg: No edema.     Left lower leg: No edema.  Skin:    General: Skin is warm.     Coloration: Skin is not jaundiced or pale.     Findings: No bruising or erythema.  Neurological:     Mental Status: He is alert and oriented to person, place, and time.     Cranial Nerves: No cranial nerve deficit.     Sensory: No sensory deficit.     Motor: No weakness or abnormal muscle tone.     Coordination: Coordination normal.     Gait: Gait normal.     Deep Tendon Reflexes: Reflexes are normal and symmetric. Reflexes normal.  Psychiatric:        Mood and Affect: Mood normal.        Behavior: Behavior normal.        Thought Content: Thought content normal.        Judgment: Judgment normal.       Assessment & Plan:  Benign essential HTN.  Switch losartan to hyzaar 100/12.5 mg poqday and recheck bp in 2 weeks.  I was able to express all of the cyst contents and a portion of the sack through the opening with firm pressure.  Keep area covered with polysporin and bandaid.  No additional treatment is necessary.

## 2024-02-20 ENCOUNTER — Ambulatory Visit: Payer: Self-pay

## 2024-02-20 NOTE — Telephone Encounter (Signed)
 3rd attempt. Left voicemail to return call. Routing for no contact X 3 follow up.  This Clinical research associate not authorized to message patient via MyChart, last patient log in today.

## 2024-02-20 NOTE — Telephone Encounter (Signed)
 1st attempt, left message to return call for nurse triage.    Copied from CRM [id number]. Topic: Appointments - Appointment Scheduling >> Feb 20, 2024  8:26 AM Christopher Bowen wrote: Bowel issues.. hasn't gone to the bathroom in 4 days.. worsening

## 2024-02-20 NOTE — Telephone Encounter (Signed)
  2nd attempt, left voicemail to return call for nurse triage.   Copied from CRM [id number]. Topic: Appointments - Appointment Scheduling >> Feb 20, 2024  8:26 AM Emylou G wrote: Bowel issues.. hasn't gone to the bathroom in 4 days.. worsening

## 2024-02-20 NOTE — Telephone Encounter (Signed)
 Reason for Disposition ?? Third attempt to contact caller AND no contact made. Phone number verified. ? ?Protocols used: No Contact or Duplicate Contact Call-A-AH ? ?

## 2024-02-24 ENCOUNTER — Ambulatory Visit: Payer: Self-pay | Admitting: Family Medicine

## 2024-02-24 ENCOUNTER — Ambulatory Visit
Admission: RE | Admit: 2024-02-24 | Discharge: 2024-02-24 | Disposition: A | Discharge status: Home / Self Care | Source: Ambulatory Visit | Attending: Family Medicine | Admitting: Family Medicine

## 2024-02-24 ENCOUNTER — Encounter: Payer: Self-pay | Admitting: Family Medicine

## 2024-02-24 ENCOUNTER — Ambulatory Visit (INDEPENDENT_AMBULATORY_CARE_PROVIDER_SITE_OTHER): Admitting: Family Medicine

## 2024-02-24 VITALS — BP 126/72 | HR 65 | Temp 98.7°F | Ht 67.0 in | Wt 190.1 lb

## 2024-02-24 DIAGNOSIS — K5900 Constipation, unspecified: Secondary | ICD-10-CM

## 2024-02-24 DIAGNOSIS — N400 Enlarged prostate without lower urinary tract symptoms: Secondary | ICD-10-CM | POA: Diagnosis not present

## 2024-02-24 DIAGNOSIS — K56609 Unspecified intestinal obstruction, unspecified as to partial versus complete obstruction: Secondary | ICD-10-CM

## 2024-02-24 DIAGNOSIS — D3502 Benign neoplasm of left adrenal gland: Secondary | ICD-10-CM | POA: Diagnosis not present

## 2024-02-24 DIAGNOSIS — I700 Atherosclerosis of aorta: Secondary | ICD-10-CM | POA: Diagnosis not present

## 2024-02-24 MED ORDER — IOPAMIDOL (ISOVUE-300) INJECTION 61%
100.0000 mL | Freq: Once | INTRAVENOUS | Status: AC | PRN
  Administered 2024-02-24: 100 mL via INTRAVENOUS

## 2024-02-24 NOTE — Progress Notes (Unsigned)
 Patient Office Visit  Assessment & Plan:   Constipation, unspecified constipation type -     CBC with Differential/Platelet -     Comprehensive metabolic panel with GFR  Intestinal obstruction, unspecified cause, unspecified whether partial or complete (HCC) -     CBC with Differential/Platelet -     Comprehensive metabolic panel with GFR -     CT ABDOMEN PELVIS W CONTRAST; Future   Assessment and Plan    Constipation- possible obstruction Chronic constipation unresponsive to OTC laxatives. Concern for bowel obstruction or fecal impaction. Reduced oral intake due to fear of worsening symptoms, leading to weight loss. - Order CT scan of the abdomen to evaluate for bowel obstruction or fecal impaction. - Order blood work to assess overall health and kidney function.  Diverticulosis Diverticulosis noted in previous colonoscopy. No current symptoms of diverticulitis.  Hypertension Hypertension managed with losartan  and hydrochlorothiazide . Blood pressure well-controlled with recent dosage increase.  General Health Maintenance 89 year old male with diverticulosis and hypertension. Recent weight loss due to decreased oral intake. No recent colonoscopy advised due to age.  Follow-up Follow-up required to assess results of CT scan and blood work. - Schedule follow-up appointment after CT scan and blood work results are available.     If CT scan negative we can consider adding Linzess for constipation   IMPRESSION: CT scan results- patient/daughter aware 1. No CT evidence for acute intra-abdominal or pelvic abnormality. 2. 14 mm left adrenal nodule, probable benign adenoma. Recommend follow-up adrenal washout CT in 1 year. If stable for greater than or equal to 1 year, no further follow-up imaging. 3. Enlarged prostate. 4. Aortic atherosclerosis.   Aortic Atherosclerosis (ICD10-I70.0).   Return if symptoms worsen or fail to improve.   Subjective:     Patient ID: Christopher Bowen, male    DOB: [birth year]  Age: 89 y.o. MRN: [id number]  Chief Complaint  Patient presents with   Constipation    "On and off" for a couple of months. Has not had a BM in 6-7 days.    HPI Discussed the use of AI scribe software for clinical note transcription with the patient, who gave verbal consent to proceed.  History of Present Illness         Christopher Bowen is an 89 year old male with diverticulosis who presents with constipation and inability to have a bowel movement for six days. He is accompanied by his daughter, who assists with communication due to his hearing difficulties.  He has not had a bowel movement for six days despite using over-the-counter laxatives such as Milk of Magnesia, Ex-Lax, and magnesium citrate. These medications resulted in passing a significant amount of clear water but no solid stool. He has not taken any more medications since Friday.  He has a history of diverticulitis with a hospitalization in 2015 due to bleeding. He experiences chronic constipation, requiring laxatives approximately every two weeks, but notes that these have been less effective recently. No abdominal pain, nausea, vomiting, fever, or chills are present. He reports a decrease in appetite and has been eating minimally, consuming items like fruit smoothies, tacos, grits, and eggs over the past few days. He has lost about ten pounds since April, which he attributes to reduced food intake. Per daughter patient has had more issues with constipation since Hyzaar medication was increased in the past.   He has been drinking a lot of water and has stopped eating ice cream to aid digestion. He reports feeling tired and having low  energy. He has been passing a little gas but no solid stool. He has a history of a CT scan and colonoscopy, with findings of diverticulosis but no recent diverticulitis.  His medications include losartan  and hydrochlorothiazide , which were recently increased from 50 mg to  100 mg. Physical Exam ABDOMEN: Bowel sounds hypoactive. Results RADIOLOGY CT scan: Diverticulosis  DIAGNOSTIC Colonoscopy: Diverticulosis, polyps Echocardiogram: Normal Assessment & Plan Constipation- possible obstruction Chronic constipation unresponsive to OTC laxatives. Concern for bowel obstruction or fecal impaction. Reduced oral intake due to fear of worsening symptoms, leading to weight loss. - Order CT scan of the abdomen to evaluate for bowel obstruction or fecal impaction. - Order blood work to assess overall health and kidney function.  Diverticulosis Diverticulosis noted in previous colonoscopy. No current symptoms of diverticulitis.  Hypertension Hypertension managed with losartan  and hydrochlorothiazide . Blood pressure well-controlled with recent dosage increase.  General Health Maintenance 89 year old male with diverticulosis and hypertension. Recent weight loss due to decreased oral intake. No recent colonoscopy advised due to age.  Follow-up Follow-up required to assess results of CT scan and blood work. - Schedule follow-up appointment after CT scan and blood work results are available.    The ASCVD Risk score (Arnett DK, et al., 2019) failed to calculate for the following reasons:   The 2019 ASCVD risk score is only valid for ages 38 to 86  Past Medical History:  Diagnosis Date   Anemia    Diverticulosis    Former smoker    Hyperlipidemia    Hypertension    Past Surgical History:  Procedure Laterality Date   COLONOSCOPY Left 03/28/2014   Procedure: COLONOSCOPY;  Surgeon: Evangeline Hilts, MD;  Location: [facility];  Service: Endoscopy;  Laterality: Left;   Social History   Tobacco Use   Smoking status: Former    Current packs/day: 0.00    Types: Cigarettes    Quit date: 09/17/1968    Years since quitting: 55.4   Smokeless tobacco: Never  Vaping Use   Vaping status: Never Used  Substance Use Topics   Alcohol use: No   Drug use: No   Family  History  Problem Relation Age of Onset   Colon cancer Neg Hx    Ulcerative colitis Neg Hx    Crohn's disease Neg Hx    Diabetes Mother    Cerebral aneurysm Mother    Heart disease Father        age 44   Diabetes Brother    Allergies  Allergen Reactions   Penicillins Anaphylaxis    ROS    Objective:    BP 126/72   Pulse 65   Temp 98.7 F (37.1 C)   Ht 5\' 7"  (1.702 m)   Wt 190 lb 2 oz (86.2 kg)   SpO2 97%   BMI 29.78 kg/m  BP Readings from Last 3 Encounters:  02/24/24 126/72  12/27/23 130/64  11/04/23 (!) 140/68   Wt Readings from Last 3 Encounters:  02/24/24 190 lb 2 oz (86.2 kg)  12/27/23 200 lb 6.4 oz (90.9 kg)  11/04/23 203 lb (92.1 kg)    Physical Exam Vitals and nursing note reviewed.  Constitutional:      General: He is not in acute distress.    Appearance: Normal appearance.  HENT:     Head: Normocephalic.     Right Ear: Tympanic membrane, ear canal and external ear normal.     Left Ear: Tympanic membrane, ear canal and external ear normal.  Ears:     Comments: Patient is hard of hearing Eyes:     Extraocular Movements: Extraocular movements intact.     Pupils: Pupils are equal, round, and reactive to light.  Cardiovascular:     Rate and Rhythm: Normal rate and regular rhythm.     Heart sounds: Normal heart sounds.  Pulmonary:     Effort: Pulmonary effort is normal.     Breath sounds: Normal breath sounds.  Abdominal:     General: Bowel sounds are normal.     Tenderness: There is no abdominal tenderness. There is no guarding or rebound.     Comments: Hypoactive bowel sounds  Neurological:     General: No focal deficit present.     Mental Status: He is alert and oriented to person, place, and time.  Psychiatric:        Mood and Affect: Mood normal.        Behavior: Behavior normal.      Results for orders placed or performed in visit on 02/24/24  CBC with Differential/Platelet  Result Value Ref Range   WBC 7.0 3.8 - 10.8  Thousand/uL   RBC 4.63 4.20 - 5.80 Million/uL   Hemoglobin 14.6 13.2 - 17.1 g/dL   HCT 74.2 59.5 - 63.8 %   MCV 94.0 80.0 - 100.0 fL   MCH 31.5 27.0 - 33.0 pg   MCHC 33.6 32.0 - 36.0 g/dL   RDW 75.6 43.3 - 29.5 %   Platelets 237 140 - 400 Thousand/uL   MPV 12.1 7.5 - 12.5 fL   Neutro Abs 4,480 1,500 - 7,800 cells/uL   Absolute Lymphocytes 1,519 850 - 3,900 cells/uL   Absolute Monocytes 917 200 - 950 cells/uL   Eosinophils Absolute 63 15 - 500 cells/uL   Basophils Absolute 21 0 - 200 cells/uL   Neutrophils Relative % 64 %   Total Lymphocyte 21.7 %   Monocytes Relative 13.1 %   Eosinophils Relative 0.9 %   Basophils Relative 0.3 %  Comprehensive metabolic panel with GFR  Result Value Ref Range   Glucose, Bld 91 65 - 99 mg/dL   BUN 7 7 - 25 mg/dL   Creat 1.88 4.16 - 6.06 mg/dL   eGFR 72 > OR = 60 TK/ZSW/1.09N2   BUN/Creatinine Ratio SEE NOTE: 6 - 22 (calc)   Sodium 130 (L) 135 - 146 mmol/L   Potassium 4.2 3.5 - 5.3 mmol/L   Chloride 90 (L) 98 - 110 mmol/L   CO2 28 20 - 32 mmol/L   Calcium  9.5 8.6 - 10.3 mg/dL   Total Protein 7.5 6.1 - 8.1 g/dL   Albumin 4.6 3.6 - 5.1 g/dL   Globulin 2.9 1.9 - 3.7 g/dL (calc)   AG Ratio 1.6 1.0 - 2.5 (calc)   Total Bilirubin 1.5 (H) 0.2 - 1.2 mg/dL   Alkaline phosphatase (APISO) 70 35 - 144 U/L   AST 31 10 - 35 U/L   ALT 21 9 - 46 U/L

## 2024-02-25 ENCOUNTER — Telehealth: Payer: Self-pay

## 2024-02-25 ENCOUNTER — Encounter: Payer: Self-pay | Admitting: Family Medicine

## 2024-02-25 ENCOUNTER — Other Ambulatory Visit: Payer: Self-pay

## 2024-02-25 LAB — CBC WITH DIFFERENTIAL/PLATELET
Absolute Lymphocytes: 1519 {cells}/uL (ref 850–3900)
Absolute Monocytes: 917 {cells}/uL (ref 200–950)
Basophils Absolute: 21 {cells}/uL (ref 0–200)
Basophils Relative: 0.3 %
Eosinophils Absolute: 63 {cells}/uL (ref 15–500)
Eosinophils Relative: 0.9 %
HCT: 43.5 % (ref 38.5–50.0)
Hemoglobin: 14.6 g/dL (ref 13.2–17.1)
MCH: 31.5 pg (ref 27.0–33.0)
MCHC: 33.6 g/dL (ref 32.0–36.0)
MCV: 94 fL (ref 80.0–100.0)
MPV: 12.1 fL (ref 7.5–12.5)
Monocytes Relative: 13.1 %
Neutro Abs: 4480 {cells}/uL (ref 1500–7800)
Neutrophils Relative %: 64 %
Platelets: 237 10*3/uL (ref 140–400)
RBC: 4.63 10*6/uL (ref 4.20–5.80)
RDW: 12.4 % (ref 11.0–15.0)
Total Lymphocyte: 21.7 %
WBC: 7 10*3/uL (ref 3.8–10.8)

## 2024-02-25 LAB — COMPREHENSIVE METABOLIC PANEL WITH GFR
AG Ratio: 1.6 (calc) (ref 1.0–2.5)
ALT: 21 U/L (ref 9–46)
AST: 31 U/L (ref 10–35)
Albumin: 4.6 g/dL (ref 3.6–5.1)
Alkaline phosphatase (APISO): 70 U/L (ref 35–144)
BUN: 7 mg/dL (ref 7–25)
CO2: 28 mmol/L (ref 20–32)
Calcium: 9.5 mg/dL (ref 8.6–10.3)
Chloride: 90 mmol/L — ABNORMAL LOW (ref 98–110)
Creat: 1.01 mg/dL (ref 0.70–1.22)
Globulin: 2.9 g/dL (ref 1.9–3.7)
Glucose, Bld: 91 mg/dL (ref 65–99)
Potassium: 4.2 mmol/L (ref 3.5–5.3)
Sodium: 130 mmol/L — ABNORMAL LOW (ref 135–146)
Total Bilirubin: 1.5 mg/dL — ABNORMAL HIGH (ref 0.2–1.2)
Total Protein: 7.5 g/dL (ref 6.1–8.1)
eGFR: 72 mL/min/{1.73_m2} (ref >=60)

## 2024-02-25 MED ORDER — LOSARTAN POTASSIUM 100 MG PO TABS: 100.0000 mg | ORAL_TABLET | Freq: Every day | ORAL | 0 refills | Status: DC

## 2024-02-25 MED ORDER — LUBIPROSTONE 8 MCG PO CAPS: 8.0000 ug | ORAL_CAPSULE | Freq: Two times a day (BID) | ORAL | 0 refills | Status: DC

## 2024-02-25 NOTE — Telephone Encounter (Signed)
 Copied from CRM [id number]. Topic: Clinical - Medication Question >> Feb 25, 2024  8:34 AM Christopher Bowen wrote: Reason for CRM: The patient's daughter, Christopher Bowen, states that the patient was sent for a cat scan that was clear at his previous appointment and was advised that Dr. [name] would send in medication for constipation but the medication has not been called in. She is requesting that it be called in to [facility] on [address] as she will be at work and unable to answer the phone.

## 2024-02-26 ENCOUNTER — Ambulatory Visit: Payer: Self-pay

## 2024-02-26 ENCOUNTER — Other Ambulatory Visit: Payer: Self-pay | Admitting: Family Medicine

## 2024-02-26 NOTE — Telephone Encounter (Signed)
 FYI Only or Action Required?: Action required by provider  Patient was last seen in primary care on 02/24/2024 by [name], MD. Called Nurse Triage reporting Constipation. Symptoms began a week ago. Interventions attempted: OTC medications: X-Lax, Miralax, Metamucil, Prescription medications: Amitiza (x2 doses), and Other: increased water intake. Symptoms are: constipation (last BM 9 days ago), decreased appetite, dizziness unchanged.  Triage Disposition: Call PCP Now (overriding See Physician Within 24 Hours)- Daughter would like to let Dr [name] know they have not tried Colace due to concern it interacts with Amlodipine  and she states the patient tried Miralax once or twice a week ago and not consecutively   Patient/caregiver understands and will follow disposition?: Yes                                  Summary: fatigue//constipation//loss of appetite   Copied From CRM [id number]. Reason for Triage: The patient's daughter, Christopher Bowen, states that the patient was seen in the office on 6/9 and prescribed a medication on 6/11 after having a clear CT scan. However, the patient still hasn't had a bowel movement and is feeling unwell and has fatigue from being unable to eat. However, she declines him having pain. Her call back number is [phone] but she goes into work at 9 or call the patient at [phone]. If you are unable to reach them, she requests a MyChart message from the office with directions on how to proceed.       Reason for Disposition  Last bowel movement (BM) > 4 days ago  Answer Assessment - Initial Assessment Questions 1. STOOL PATTERN OR FREQUENCY: How often do you have a bowel movement (BM)?  (Normal range: 3 times a day to every 3 days)  When was your last BM?       Last BM 9 days ago. Daughter unsure how often his baseline is.  2. STRAINING: Do you have to strain to have a BM?      Unsure.  3. RECTAL PAIN: Does your rectum hurt  when the stool comes out? If Yes, ask: Do you have hemorrhoids? How bad is the pain?  (Scale 1-10; or mild, moderate, severe)     No.  4. STOOL COMPOSITION: Are the stools hard?      Unsure.  5. BLOOD ON STOOLS: Has there been any blood on the toilet tissue or on the surface of the BM? If Yes, ask: When was the last time?     No.  6. CHRONIC CONSTIPATION: Is this a new problem for you?  If No, ask: How long have you had this problem? (days, weeks, months)      Yes.  7. CHANGES IN DIET OR HYDRATION: Have there been any recent changes in your diet? How much fluids are you drinking on a daily basis?  How much have you had to drink today?     Poor appetite, decreased intake. She states he has been drinking a lot of water.  8. MEDICINES: Have you been taking any new medicines? Are you taking any narcotic pain medicines? (e.g., Dilaudid, morphine, Percocet, Vicodin)     Amitiza.  9. LAXATIVES: Have you been using any stool softeners, laxatives, or enemas?  If Yes, ask What, how often, and when was the last time?     X lax, Miralax, Magnesium citrate (he would try one of the laxatives daily but stopped on 02/24/24) metamucil was taking it daily  and stopped before seeing the doctor on 02/24/24.  10. ACTIVITY:  How much walking do you do every day?  Has your activity level decreased in the past week?        She reports he is very active, the last few days he has been less active.   11. CAUSE: What do you think is causing the constipation?        Unsure.  12. OTHER SYMPTOMS: Do you have any other symptoms? (e.g., abdomen pain, bloating, fever, vomiting)      Poor appetite and dizziness yesterday. Denies abdominal pain, blood in stool, nausea, vomiting.   13. MEDICAL HISTORY: Do you have a history of hemorrhoids, rectal fissures, or rectal surgery or rectal abscess?         GI bleed, diverticulosis.  14. PREGNANCY: Is there any chance you are pregnant? When  was your last menstrual period?       N/A.  Protocols used: Constipation-A-AH

## 2024-03-12 ENCOUNTER — Ambulatory Visit: Admitting: Family Medicine

## 2024-03-20 ENCOUNTER — Other Ambulatory Visit: Payer: Self-pay | Admitting: Family Medicine

## 2024-04-01 ENCOUNTER — Other Ambulatory Visit: Payer: Self-pay | Admitting: Family Medicine

## 2024-06-16 ENCOUNTER — Other Ambulatory Visit: Payer: Self-pay | Admitting: Family Medicine

## 2024-07-03 ENCOUNTER — Other Ambulatory Visit: Payer: Self-pay | Admitting: Family Medicine

## 2024-07-06 NOTE — Telephone Encounter (Signed)
 Requested Prescriptions  Pending Prescriptions Disp Refills   amLODipine  (NORVASC ) 10 MG tablet [Pharmacy Med Name: amLODIPine  Besylate 10 MG Oral Tablet] 90 tablet 0    Sig: Take 1 tablet by mouth once daily     Cardiovascular: Calcium  Channel Blockers 2 Failed - 07/06/2024  4:14 PM      Failed - Valid encounter within last 6 months    Recent Outpatient Visits           4 months ago Constipation, unspecified constipation type   [place] [facility] Aletha Bene, MD   6 months ago Benign essential HTN   [place] [facility] Duanne Butler DASEN, MD   8 months ago Benign essential HTN   [place] [facility] Duanne Butler DASEN, MD   8 months ago Benign essential HTN   [place] [facility] Kayla Jeoffrey RAMAN, FNP   1 year ago Benign essential HTN   [place] [facility] Pickard, Butler DASEN, MD              Passed - Last BP in normal range    BP Readings from Last 1 Encounters:  02/24/24 126/72         Passed - Last Heart Rate in normal range    Pulse Readings from Last 1 Encounters:  02/24/24 65

## 2024-07-18 ENCOUNTER — Other Ambulatory Visit: Payer: Self-pay | Admitting: Family Medicine

## 2024-09-15 ENCOUNTER — Other Ambulatory Visit: Payer: Self-pay | Admitting: Family Medicine

## 2024-09-18 ENCOUNTER — Other Ambulatory Visit: Payer: Self-pay | Admitting: Family Medicine
